# Patient Record
Sex: Female | Born: 1970 | Race: White | Hispanic: No | Marital: Married | Smoking: Former smoker
Health system: Southern US, Community
[De-identification: ages and names within clinical notes are randomized; demographics above are authoritative.]

## PROBLEM LIST (undated history)

## (undated) DIAGNOSIS — I10 Essential (primary) hypertension: Secondary | ICD-10-CM

## (undated) DIAGNOSIS — N390 Urinary tract infection, site not specified: Secondary | ICD-10-CM

## (undated) DIAGNOSIS — Z789 Other specified health status: Secondary | ICD-10-CM

## (undated) DIAGNOSIS — K219 Gastro-esophageal reflux disease without esophagitis: Secondary | ICD-10-CM

## (undated) DIAGNOSIS — J302 Other seasonal allergic rhinitis: Secondary | ICD-10-CM

## (undated) HISTORY — PX: MOUTH SURGERY: SHX715

## (undated) HISTORY — PX: KNEE SURGERY: SHX244

## (undated) HISTORY — PX: NO PAST SURGERIES: SHX2092

---

## 2008-10-25 ENCOUNTER — Ambulatory Visit: Payer: Self-pay | Admitting: Internal Medicine

## 2009-02-22 IMAGING — CR LEFT WRIST - 2 VIEW
1 series · 2 of 2 positions shown · non-contrast
Comparison: none

REASON FOR EXAM: fell pain wrist hand forearm
COMMENTS:

PROCEDURE:     MDR - MDR WRIST LEFT AP AND LATERAL  - October 25, 2008 [DATE]
RESULT:     Two views of the wrist were obtained. No fracture, dislocation
or other acute bony abnormality is identified.

[Series 1: view not recorded · 0.17mm/px · 2 of 2 slices shown]
[im 1/2]
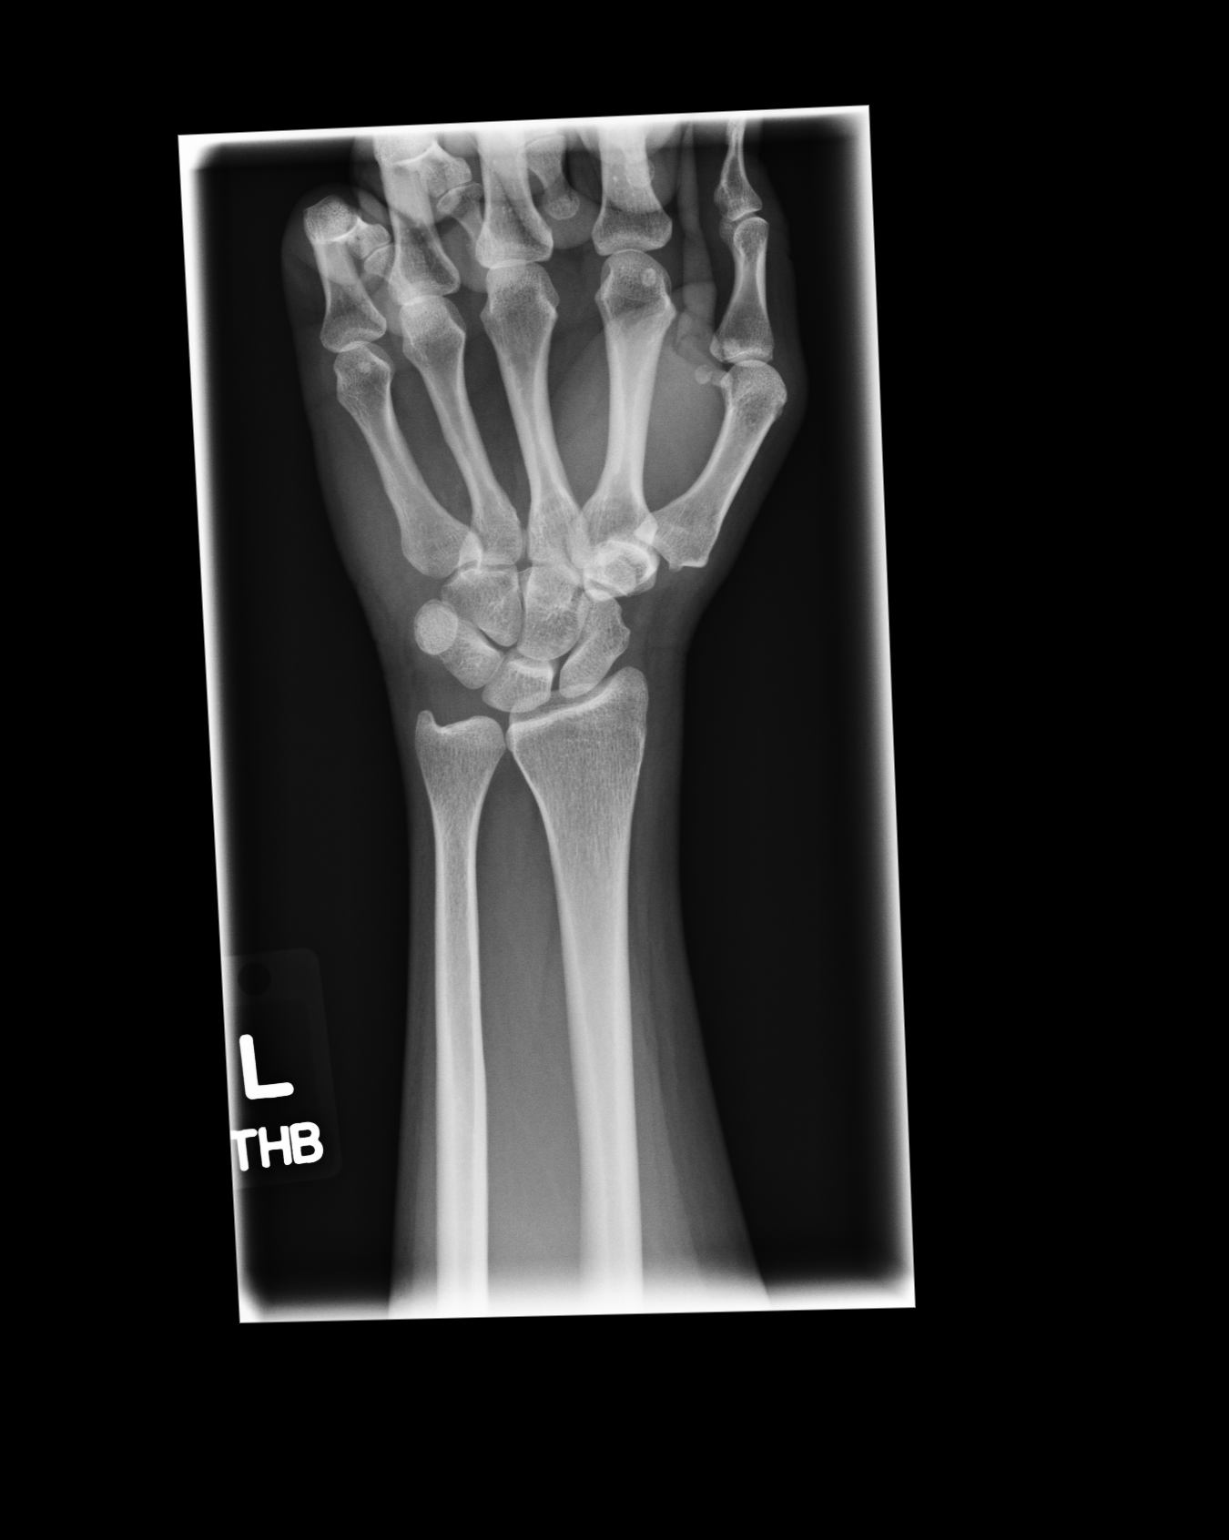
[im 2/2]
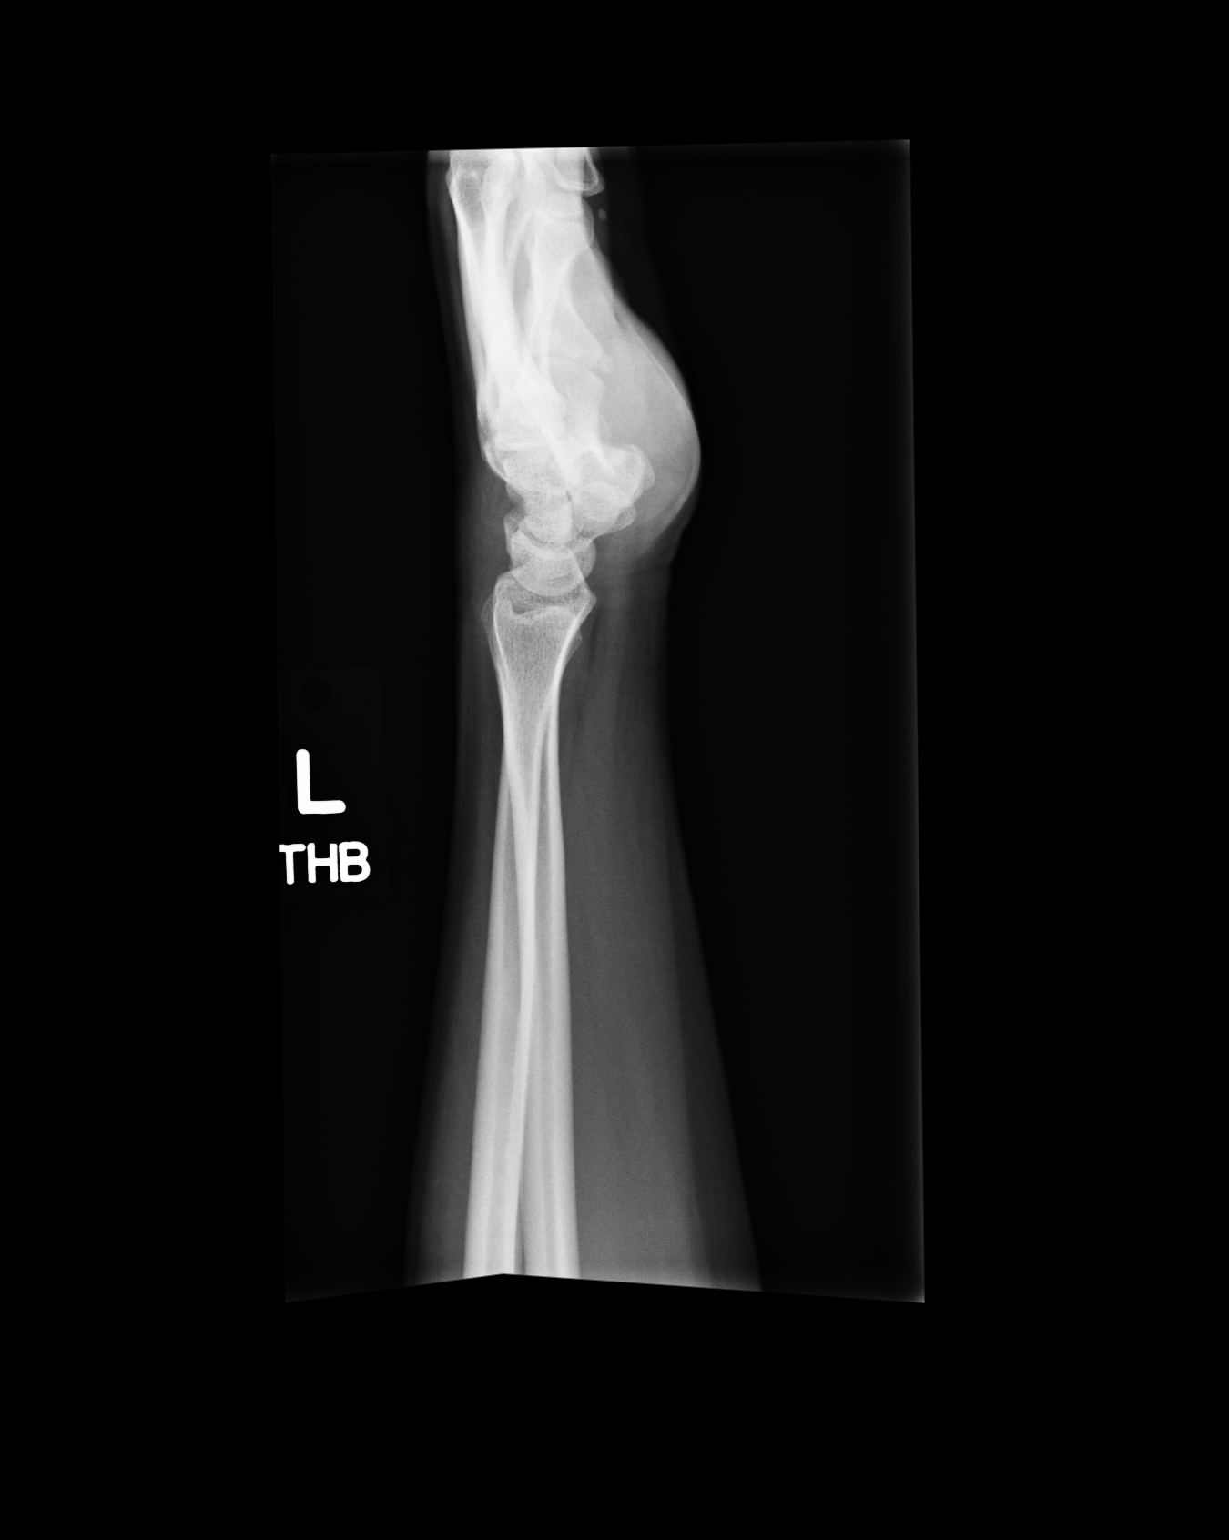

[2 of 2 positions shown; findings below may reference images not displayed]

IMPRESSION: 1.     No significant osseous abnormalities are noted.

## 2011-10-14 ENCOUNTER — Ambulatory Visit: Payer: Self-pay

## 2012-06-09 DIAGNOSIS — R3129 Other microscopic hematuria: Secondary | ICD-10-CM | POA: Insufficient documentation

## 2013-06-19 DIAGNOSIS — A048 Other specified bacterial intestinal infections: Secondary | ICD-10-CM | POA: Insufficient documentation

## 2014-03-13 DIAGNOSIS — Z01419 Encounter for gynecological examination (general) (routine) without abnormal findings: Secondary | ICD-10-CM | POA: Insufficient documentation

## 2015-06-16 ENCOUNTER — Ambulatory Visit
Admission: EM | Admit: 2015-06-16 | Discharge: 2015-06-16 | Disposition: A | Discharge status: Home / Self Care | Payer: Managed Care, Other (non HMO) | Attending: Internal Medicine | Admitting: Internal Medicine

## 2015-06-16 DIAGNOSIS — N390 Urinary tract infection, site not specified: Secondary | ICD-10-CM | POA: Insufficient documentation

## 2015-06-16 DIAGNOSIS — R350 Frequency of micturition: Secondary | ICD-10-CM | POA: Diagnosis present

## 2015-06-16 HISTORY — DX: Other specified health status: Z78.9

## 2015-06-16 LAB — URINALYSIS COMPLETE WITH MICROSCOPIC (ARMC ONLY)
BILIRUBIN URINE: NEGATIVE
Glucose, UA: NEGATIVE mg/dL
Ketones, ur: NEGATIVE mg/dL
Nitrite: POSITIVE — AB
Protein, ur: 100 mg/dL — AB
SQUAMOUS EPITHELIAL / LPF: NONE SEEN — AB
Specific Gravity, Urine: 1.015 (ref 1.005–1.030)
pH: 5 (ref 5.0–8.0)

## 2015-06-16 MED ORDER — NITROFURANTOIN MONOHYD MACRO 100 MG PO CAPS: 100.0000 mg | ORAL_CAPSULE | Freq: Two times a day (BID) | ORAL | Status: DC

## 2015-06-16 NOTE — Discharge Instructions (Signed)
Urinalysis today suggests urinary tract infection; a urine culture is pending. Prescription for macrobid (nitrofurantoin, an antibiotic) was sent to the [facility] in [place]. Recheck for fever >100.5, worsening urinary symptoms, or if not starting to improve in 2-3 days.

## 2015-06-16 NOTE — ED Notes (Signed)
Pt reports she been experiencing lower back pain and urinary frequency since Monday. Pt is also experiencing burning with urination and urgency.

## 2015-06-16 NOTE — ED Provider Notes (Signed)
CSN: [id number]     Arrival date & time 06/16/15  1653 History   First MD Initiated Contact with Patient 06/16/15 1814     Chief Complaint  Patient presents with  . Back Pain  . 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 This became severe last evening, she had a difficult time sleeping. She has had dysuria, urinary frequency. Tactile temps today. Nausea, no vomiting. No change in bowel habits, no diarrhea. No unusual vaginal bleeding or discharge. History of urinary tract infections. She also says that she had a physical with her PCP, [facility], in the last month. Urinalysis demonstrated trace blood, which she says is chronic for her. She is going to have it rechecked with her PCP in a month.  Past Medical History  Diagnosis Date  . Patient denies medical problems    Past Surgical History  Procedure Laterality Date  . No past surgeries     Family History  Problem Relation Age of Onset  . Irritable bowel syndrome Mother   . Nephrolithiasis Mother   . Heart attack Father    Social History  Substance Use Topics  . Smoking status: Former Smoker    Quit date: 11/04/1985  . Smokeless tobacco: Never Used  . Alcohol Use: Yes     Comment: occasionally   Patient has her own cleaning business.  OB History    Gravida Para Term Preterm AB TAB SAB Ectopic Multiple Living   1 0   1          Review of Systems  All other systems reviewed and are negative.   Allergies  Penicillins and Shellfish allergy  Home Medications   Prior to Admission medications   Medication Sig Start Date End Date Taking? Authorizing Provider  acetaminophen (TYLENOL) 325 MG tablet Take 650 mg by mouth every 6 (six) hours as needed.   Yes Historical Provider, MD  levonorgestrel-ethinyl estradiol (JOLESSA) 0.15-0.03 MG tablet Take 1 tablet by mouth daily.   Yes Historical Provider, MD  loratadine  (ALLERGY) 10 MG tablet Take 10 mg by mouth daily.   Yes Historical Provider, MD   BP 130/83 mmHg  Temp(Src) 98.4 F (36.9 C) (Oral)  Resp 16  Ht 5\' 4"  (1.626 m)  Wt 145 lb (65.772 kg)  BMI 24.88 kg/m2  SpO2 99%  LMP 04/17/2015 (Approximate) Physical Exam  Constitutional: She is oriented to person, place, and time. No distress.  Alert, nicely groomed  HENT:  Head: Atraumatic.  Eyes:  Conjugate gaze, no eye redness/drainage  Neck: Neck supple.  Cardiovascular: Normal rate.   Pulmonary/Chest: No respiratory distress.  Abdominal: She exhibits no distension.  Musculoskeletal: Normal range of motion.  No leg swelling  Neurological: She is alert and oriented to person, place, and time.  Skin: Skin is warm and dry.  No cyanosis  Nursing note and vitals reviewed.   ED Course  Procedures  Results for orders placed or performed during the hospital encounter of 06/16/15  Urinalysis complete, with microscopic  Result Value Ref Range   Color, Urine AMBER (A) YELLOW   APPearance CLOUDY (A) CLEAR   Glucose, UA NEGATIVE NEGATIVE mg/dL   Bilirubin Urine NEGATIVE NEGATIVE   Ketones, ur NEGATIVE NEGATIVE mg/dL   Specific Gravity, Urine 1.015 1.005 - 1.030   Hgb urine dipstick 3+ (A) NEGATIVE   pH 5.0 5.0 - 8.0   Protein, ur 100 (  A) NEGATIVE mg/dL   Nitrite POSITIVE (A) NEGATIVE   Leukocytes, UA 1+ (A) NEGATIVE   RBC / HPF TOO NUMEROUS TO COUNT <3 RBC/hpf   WBC, UA TOO NUMEROUS TO COUNT <3 WBC/hpf   Bacteria, UA FEW (A) RARE   Squamous Epithelial / LPF NONE SEEN (A) RARE   Urine culture pending   MDM   1. UTI (lower urinary tract infection)    Discharge Medication List as of 06/16/2015  6:43 PM    START taking these medications   Details  nitrofurantoin, macrocrystal-monohydrate, (MACROBID) 100 MG capsule Take 1 capsule (100 mg total) by mouth 2 (two) times daily., Starting 06/16/2015, Until Discontinued, Normal       Recheck for persistent urinary symptoms, fever >100.5,  or if not starting to improve in a few days.    Eustace Moore, MD 06/16/15 2132

## 2015-06-17 ENCOUNTER — Telehealth: Payer: Self-pay | Admitting: Family Medicine

## 2015-06-17 MED ORDER — FLUCONAZOLE 150 MG PO TABS: 150.0000 mg | ORAL_TABLET | Freq: Every day | ORAL | Status: DC

## 2015-06-17 NOTE — Progress Notes (Signed)
rx sent

## 2015-06-18 LAB — URINE CULTURE

## 2015-06-19 ENCOUNTER — Ambulatory Visit
Admission: EM | Admit: 2015-06-19 | Discharge: 2015-06-19 | Disposition: A | Discharge status: Home / Self Care | Payer: Managed Care, Other (non HMO) | Attending: Emergency Medicine | Admitting: Emergency Medicine

## 2015-06-19 ENCOUNTER — Encounter: Payer: Self-pay | Admitting: Emergency Medicine

## 2015-06-19 DIAGNOSIS — T63461A Toxic effect of venom of wasps, accidental (unintentional), initial encounter: Secondary | ICD-10-CM | POA: Diagnosis not present

## 2015-06-19 MED ORDER — IBUPROFEN 800 MG PO TABS
800.0000 mg | ORAL_TABLET | Freq: Four times a day (QID) | ORAL | Status: DC | PRN
Start: 2015-06-19 — End: 2015-08-08

## 2015-06-19 MED ORDER — FAMOTIDINE 20 MG PO TABS: 20.0000 mg | ORAL_TABLET | Freq: Two times a day (BID) | ORAL | Status: DC

## 2015-06-19 MED ORDER — TRIAMCINOLONE ACETONIDE 0.1 % EX CREA: 1.0000 "application " | TOPICAL_CREAM | Freq: Two times a day (BID) | CUTANEOUS | Status: DC

## 2015-06-19 MED ORDER — SULFAMETHOXAZOLE-TRIMETHOPRIM 800-160 MG PO TABS: 2.0000 | ORAL_TABLET | Freq: Two times a day (BID) | ORAL | Status: DC

## 2015-06-19 NOTE — Discharge Instructions (Signed)
Claritin, Pepcid, ice, ibuprofen, topical skin steroid cream as we discussed. Wait-and-see prescription of Bactrim if not getting better in 48 hours or if getting worse

## 2015-06-19 NOTE — ED Provider Notes (Signed)
HPI  SUBJECTIVE:  Alyssa Valencia is a 44 y.o. female who presents with multiple yellow jacket stings sustained6 yesterday. Patient estimates that she was stung approximately 9 times. She reports burning and itching last night, now increasing erythema, edema, pain at several of the bite sites. She is having pain in her neck, 2 on her back, and one on her lateral right thigh. She states that the one on her leg is the worse, and is reporting joint aches in that leg. She is able to move her knee without any problem. No nausea, vomiting, fevers, lip swelling, diarrhea, syncope, shortness of breath, wheezing, voice changes. She tried 25 mg of Benadryl this morning without improvement. She takes Claritin daily for seasonal allergies. She also tried ice. Symptoms are better with elevation of her leg, worse with bending her knee, and turning her neck. Past medical history negative for diabetes, hypertension, anaphylaxis.  Past Medical History  Diagnosis Date  . Patient denies medical problems     Past Surgical History  Procedure Laterality Date  . No past surgeries      Family History  Problem Relation Age of Onset  . Irritable bowel syndrome Mother   . Nephrolithiasis Mother   . Heart attack Father     Social History  Substance Use Topics  . Smoking status: Former Smoker    Quit date: 11/04/1985  . Smokeless tobacco: Never Used  . Alcohol Use: Yes     Comment: occasionally    No current facility-administered medications for this encounter.  Current outpatient prescriptions:  .  acetaminophen (TYLENOL) 325 MG tablet, Take 650 mg by mouth every 6 (six) hours as needed., Disp: , Rfl:  .  famotidine (PEPCID) 20 MG tablet, Take 1 tablet (20 mg total) by mouth 2 (two) times daily., Disp: 10 tablet, Rfl: 0 .  fluconazole (DIFLUCAN) 150 MG tablet, Take 1 tablet (150 mg total) by mouth daily., Disp: 1 tablet, Rfl: 1 .  ibuprofen (ADVIL,MOTRIN) 800 MG tablet, Take 1 tablet (800 mg total) by mouth  every 6 (six) hours as needed., Disp: 30 tablet, Rfl: 0 .  levonorgestrel-ethinyl estradiol (JOLESSA) 0.15-0.03 MG tablet, Take 1 tablet by mouth daily., Disp: , Rfl:  .  loratadine (ALLERGY) 10 MG tablet, Take 10 mg by mouth daily., Disp: , Rfl:  .  nitrofurantoin, macrocrystal-monohydrate, (MACROBID) 100 MG capsule, Take 1 capsule (100 mg total) by mouth 2 (two) times daily., Disp: 10 capsule, Rfl: 0 .  sulfamethoxazole-trimethoprim (BACTRIM DS,SEPTRA DS) 800-160 MG per tablet, Take 2 tablets by mouth 2 (two) times daily., Disp: 40 tablet, Rfl: 0 .  triamcinolone cream (KENALOG) 0.1 %, Apply 1 application topically 2 (two) times daily. Apply for 2 weeks. May use on face, Disp: 30 g, Rfl: 0  Allergies  Allergen Reactions  . Penicillins     "Drowsy"  . Shellfish Allergy Rash     ROS  As noted in HPI.   Physical Exam  BP 121/82 mmHg  Pulse 77  Temp(Src) 97.2 F (36.2 C) (Tympanic)  Resp 16  Ht  (1.626 m)  Wt 142 lb (64.411 kg)  BMI 24.36 kg/m2  SpO2 100%  LMP 04/17/2015 (Approximate)  Constitutional: Well developed, well nourished, no acute distress Eyes:  EOMI, conjunctiva normal bilaterally HENT: Normocephalic, atraumatic,mucus membranes moist Respiratory: Normal inspiratory effort Cardiovascular: Normal rate GI: nondistended skin: 2 x 1.5 cm warm tender erythema on left neck with central pustule, 5.5 x 5 cm, 6.5 x 5 cm area of warm tender  erythema with central pustules on her back, warm tender area of erythema with central pustule measuring 6 x 4 cm lateral right knee.  Musculoskeletal: no deformities, FROM R knee, knee exam WNL.  Neurologic: Alert & oriented x 3, no focal neuro deficits Psychiatric: Speech and behavior appropriate   ED Course   Medications - No data to display  No orders of the defined types were placed in this encounter.    No results found for this or any previous visit (from the past 24 hour(s)). No results found.  ED Clinical  Impression  Yellow jacket sting, accidental or unintentional, initial encounter  ED Assessment/Plan  No evidence of anaphylaxis  or other systemic involvement. send home with Pepcid for 5 days, ibuprofen, patient to continue her Claritin. Patient to continue ice packs. Also sending home with topical steriod, bactrim DS if areas of erythema started getting worse or not responding to this therapy. Patient follow-up with primary care or return here if not getting better or getting worse. Patient to go to the ED for any signs of anaphylaxis. Discussed MDM, plan and followup with patient . Discussed sn/sx that should prompt return to the UC or ED. Patient agrees with plan.   *This clinic note was created using Dragon dictation software. Therefore, there may be occasional mistakes despite careful proofreading.  ?   Domenick Gong, MD 06/19/15 [phone]

## 2015-06-19 NOTE — ED Notes (Signed)
Patient states that she had several bee stings yesterday. Patient reports swelling at the sites.  Patient denies difficulty breathing or swelling in her tongue.

## 2015-08-08 ENCOUNTER — Ambulatory Visit
Admission: EM | Admit: 2015-08-08 | Discharge: 2015-08-08 | Disposition: A | Discharge status: Home / Self Care | Payer: Managed Care, Other (non HMO) | Attending: Family Medicine | Admitting: Family Medicine

## 2015-08-08 DIAGNOSIS — M545 Low back pain, unspecified: Secondary | ICD-10-CM

## 2015-08-08 DIAGNOSIS — N390 Urinary tract infection, site not specified: Secondary | ICD-10-CM | POA: Diagnosis not present

## 2015-08-08 HISTORY — DX: Urinary tract infection, site not specified: N39.0

## 2015-08-08 LAB — URINALYSIS COMPLETE WITH MICROSCOPIC (ARMC ONLY)
Bilirubin Urine: NEGATIVE
GLUCOSE, UA: NEGATIVE mg/dL
Ketones, ur: NEGATIVE mg/dL
Nitrite: NEGATIVE
Protein, ur: 30 mg/dL — AB
Specific Gravity, Urine: 1.025 (ref 1.005–1.030)
pH: 5 (ref 5.0–8.0)

## 2015-08-08 MED ORDER — CEFIXIME 400 MG PO CAPS: 400.0000 mg | ORAL_CAPSULE | Freq: Every day | ORAL | Status: DC

## 2015-08-08 MED ORDER — FLUCONAZOLE 150 MG PO TABS: 150.0000 mg | ORAL_TABLET | Freq: Every day | ORAL | Status: DC

## 2015-08-08 NOTE — ED Notes (Signed)
Started Friday with slight burning with voiding. Yesterday symptoms worsened. Increased burning, left Nangle back pain, and small amounts

## 2015-08-09 NOTE — ED Provider Notes (Addendum)
CSN: [id number]     Arrival date & time 08/08/15  1517 History   First MD Initiated Contact with Patient 08/08/15 1543     Chief Complaint  Patient presents with  . Cystitis   (Consider location/radiation/quality/duration/timing/severity/associated sxs/prior Treatment) HPI Comments: married caucasian female was seen 11 Aug by Dr Dayton Scrape for UTI symptoms urine culture 4K organisms not identified on macrobid but given bactrim Rx a couple days later for skin infection from insect bites.  Family Hx Mother with kidney cyst sx and lithotripsy for stones unsure of type and frequent UTIs; patient reported she had UTIs when first married but none until this past year unsure why denied new sexual partners.   Has had Maita midline back pain, burning with urination and decreased urinary output per patient.  +Chills denied fever, rash, vomiting, headache.  I think I caught this one a lot earlier.  Have not seen any blood this time.  Has azo OTC at home.  Bladder spasms after sex.  The history is provided by the patient.    Past Medical History  Diagnosis Date  . Patient denies medical problems   . UTI (lower urinary tract infection)    Past Surgical History  Procedure Laterality Date  . No past surgeries     Family History  Problem Relation Age of Onset  . Irritable bowel syndrome Mother   . Nephrolithiasis Mother   . Heart attack Father    Social History  Substance Use Topics  . Smoking status: Former Smoker    Quit date: 11/04/1985  . Smokeless tobacco: Never Used  . Alcohol Use: Yes     Comment: occasionally   OB History    Gravida Para Term Preterm AB TAB SAB Ectopic Multiple Living   1 0   1          Review of Systems  Constitutional: Positive for chills. Negative for fever, diaphoresis, activity change, appetite change, fatigue and unexpected weight change.  HENT: Negative for congestion, dental problem, drooling, ear discharge, ear pain, facial swelling, hearing loss, mouth sores,  nosebleeds, postnasal drip, rhinorrhea, sinus pressure, sneezing, sore throat, tinnitus, trouble swallowing and voice change.   Eyes: Negative for photophobia, pain, discharge, redness, itching and visual disturbance.  Respiratory: Negative for cough, choking, shortness of breath, wheezing and stridor.   Cardiovascular: Negative for chest pain and leg swelling.  Gastrointestinal: Negative for nausea, vomiting, abdominal pain, diarrhea, constipation, blood in stool and abdominal distention.  Endocrine: Negative for cold intolerance and heat intolerance.  Genitourinary: Positive for dysuria, urgency, frequency and decreased urine volume. Negative for hematuria, flank pain, vaginal bleeding, vaginal discharge, enuresis, difficulty urinating, genital sores, vaginal pain, menstrual problem and pelvic pain.  Musculoskeletal: Positive for back pain. Negative for myalgias, joint swelling, arthralgias, gait problem, neck pain and neck stiffness.  Skin: Negative for color change, pallor, rash and wound.  Allergic/Immunologic: Positive for environmental allergies. Negative for food allergies.  Neurological: Negative for dizziness, tremors, seizures, syncope, facial asymmetry, speech difficulty, weakness, light-headedness, numbness and headaches.  Hematological: Negative for adenopathy. Does not bruise/bleed easily.  Psychiatric/Behavioral: Negative for behavioral problems, confusion, sleep disturbance and agitation.    Allergies  Penicillins and Shellfish allergy  Home Medications   Prior to Admission medications   Medication Sig Start Date End Date Taking? Authorizing Provider  acetaminophen (TYLENOL) 325 MG tablet Take 650 mg by mouth every 6 (six) hours as needed.   Yes Historical Provider, MD  famotidine (PEPCID) 20 MG tablet Take 1  tablet (20 mg total) by mouth 2 (two) times daily. 06/19/15  Yes Domenick Gong, MD  levonorgestrel-ethinyl estradiol (JOLESSA) 0.15-0.03 MG tablet Take 1 tablet by  mouth daily.   Yes Historical Provider, MD  Cefixime (SUPRAX) 400 MG CAPS capsule Take 1 capsule (400 mg total) by mouth daily. 08/08/15   Barbaraann Barthel, NP  fluconazole (DIFLUCAN) 150 MG tablet Take 1 tablet (150 mg total) by mouth daily. 08/08/15   Barbaraann Barthel, NP  loratadine (ALLERGY) 10 MG tablet Take 10 mg by mouth daily.    Historical Provider, MD   Meds Ordered and Administered this Visit  Medications - No data to display  BP 130/86 mmHg  Pulse 72  Temp(Src) 98 F (36.7 C) (Tympanic)  Resp 16  Ht 5\' 4"  (1.626 m)  Wt 142 lb (64.411 kg)  BMI 24.36 kg/m2  SpO2 100%  LMP 06/22/2015 (Approximate) No data found.   Physical Exam  Constitutional: She is oriented to person, place, and time. Vital signs are normal. She appears well-developed and well-nourished. No distress.  HENT:  Head: Normocephalic and atraumatic.  Right Ear: External ear normal.  Left Ear: External ear normal.  Nose: Nose normal.  Mouth/Throat: Oropharynx is clear and moist. No oropharyngeal exudate.  Eyes: Conjunctivae, EOM and lids are normal. Pupils are equal, round, and reactive to light. Right eye exhibits no discharge. Left eye exhibits no discharge. No scleral icterus.  Neck: Trachea normal and normal range of motion. Neck supple. No tracheal tenderness, no spinous process tenderness and no muscular tenderness present. No rigidity. No tracheal deviation, no edema, no erythema and normal range of motion present. No thyroid mass and no thyromegaly present.  Cardiovascular: Normal rate, regular rhythm, S1 normal, S2 normal, normal heart sounds and intact distal pulses.  Exam reveals no gallop and no friction rub.   No murmur heard. Pulmonary/Chest: Effort normal and breath sounds normal. No accessory muscle usage or stridor. No respiratory distress. She has no decreased breath sounds. She has no wheezes. She has no rhonchi. She has no rales. She exhibits no tenderness.  Abdominal: Soft. Bowel sounds are  normal. She exhibits no shifting dullness, no distension, no pulsatile liver, no fluid wave, no abdominal bruit, no ascites, no pulsatile midline mass and no mass. There is no hepatosplenomegaly. There is no tenderness. There is no rigidity, no rebound, no guarding, no CVA tenderness, no tenderness at McBurney's point and negative Murphy's sign. Hernia confirmed negative in the ventral area.  Dull to percussion x 3 quads; tympanny RUQ  Musculoskeletal: Normal range of motion. She exhibits no edema.       Cervical back: Normal.       Thoracic back: Normal.       Lumbar back: She exhibits tenderness, pain and spasm. She exhibits normal range of motion, no bony tenderness, no swelling, no edema, no deformity, no laceration and normal pulse.       Back:  Bilateral L1-L3 paraspinal spasms and slightly TTP bilateral SI joints; full AROM  Lymphadenopathy:    She has no cervical adenopathy.  Neurological: She is alert and oriented to person, place, and time. She displays no atrophy and no tremor. No cranial nerve deficit or sensory deficit. She exhibits normal muscle tone. She displays no seizure activity. Coordination and gait normal. GCS eye subscore is 4. GCS verbal subscore is 5. GCS motor subscore is 6.  Reflex Scores:      Patellar reflexes are 2+ on the right side and 2+  on the left side.      Achilles reflexes are 2+ on the right side and 2+ on the left side. Skin: Skin is warm, dry and intact. No rash noted. She is not diaphoretic. No erythema. No pallor.  Psychiatric: She has a normal mood and affect. Her speech is normal and behavior is normal. Judgment and thought content normal. Cognition and memory are normal.  Nursing note and vitals reviewed.   ED Course  Procedures (including critical care time)  Labs Review Labs Reviewed  URINALYSIS COMPLETEWITH MICROSCOPIC ([facility] ONLY) - Abnormal; Notable for the following:    APPearance HAZY (*)    Hgb urine dipstick 2+ (*)    Protein, ur 30  (*)    Leukocytes, UA 1+ (*)    Bacteria, UA MANY (*)    Squamous Epithelial / LPF 6-30 (*)    All other components within normal limits  URINE CULTURE  Reviewed previous urinalysis and urine culture results in Epic Care everywhere.  E.coli no resistence.  Imaging Review No results found.   MDM   1. UTI (lower urinary tract infection)   2. Midline Kinser back pain without sciatica    Last  UTI 11 Aug completed macrobid and bactrim and still felt like symptoms didn't completely resolve.  Last urine culture 2014 e.coli susceptible to everything.  Allergy to penicillin; cephalosporins ok in the past.  Wants different antibiotic today.  Discussed urinalysis results and given copy of previous urinalysis and culture results to take to [facility] for follow up visit routine physical scheduled for one week.  Discussed may be more frequent UTIs due to perimenopause or has become colonized and may require longer treatment.  Will mail urine culture results when available per patient request discussed typically 48 hours with patient.  If resistance to prescribed antibiotic will call with new antibiotic name/instructions also.  Consider urology referral/prophylaxis antibiotics with sex to discuss with PCM at appt next week.  Suprax 400mg  po daily dosing preferred by patient x 7 days.  Follow up for re-evaluation if unable to void every 8 hours, gross hematuria, worsening back pain, vomiting, abdominal pain, tea colored urine.  Push fluids.  Has OTC pyridium at home for prn po use.  Currently showers after sex.  Discussed to use back up birth control due to antibiotic use may decrease efficicacy.  Frequently gets vaginal yeast infections after antibiotic use diflucan 150mg  po x 1 Rx given to patient for prn use.  Discussed po intake of active cultures, avoid pantiliner daily use.   Call or return to clinic as needed if these symptoms worsen or fail to improve as anticipated.  [facility] handout on cystitis, kidney stone  prevention diet given to patient.  Discussed with patient due to small amount of urine, urine culture may be negative this time also.  Discussed signs and symptoms of kidney stones with patient and patient to ask mother what type of kidney stones she has also.  Discussed with patient if sand sized symptoms could be similar to UTI; larger obstructing stones typically more flank/back pain.  Patient refused imaging at this time.  Patient verbalized understanding of information/instructions, agreed with plan of care and had no further questions at this time. P2: hydration  For acute pain, rest, and intermittent application of heat (do not sleep on heating pad).  I discussed longer-term treatment plan of PRN PO NSAIDS tylenol 1000mg  po QID prn and I discussed a home back care exercise program with a strengthening and flexibility  exercise.  Proper avoidance of heavy lifting discussed.  Consider physical therapy or chiropractic care and radiology if not improving.  Call or return to clinic as needed if these symptoms worsen or fail to improve as anticipated especially leg weakness, loss of bowel/bladder control or saddle paresthesias.   Patient verbalized understanding of instructions/information and agreed with plan of care.  P2:  Injury Prevention, fitness Barbaraann Barthel, NP 08/09/15 1317  Barbaraann Barthel, NP 08/09/15 1446  12 Aug 2015 at 1930  Telephone message left for patient to contact clinic for lab results and letter sent to address on file.  E Coli no resistance noted on culture.  Barbaraann Barthel, NP 08/12/15 1932

## 2015-08-09 NOTE — Discharge Instructions (Signed)
Dietary Guidelines to Help Prevent Kidney Stones Your risk of kidney stones can be decreased by adjusting the foods you eat. The most important thing you can do is drink enough fluid. You should drink enough fluid to keep your urine clear or pale yellow. The following guidelines provide specific information for the type of kidney stone you have had. GUIDELINES ACCORDING TO TYPE OF KIDNEY STONE Calcium Oxalate Kidney Stones  Reduce the amount of salt you eat. Foods that have a lot of salt cause your body to release excess calcium into your urine. The excess calcium can combine with a substance called oxalate to form kidney stones.  Reduce the amount of animal protein you eat if the amount you eat is excessive. Animal protein causes your body to release excess calcium into your urine. Ask your dietitian how much protein from animal sources you should be eating.  Avoid foods that are high in oxalates. If you take vitamins, they should have less than 500 mg of vitamin C. Your body turns vitamin C into oxalates. You do not need to avoid fruits and vegetables high in vitamin C. Calcium Phosphate Kidney Stones  Reduce the amount of salt you eat to help prevent the release of excess calcium into your urine.  Reduce the amount of animal protein you eat if the amount you eat is excessive. Animal protein causes your body to release excess calcium into your urine. Ask your dietitian how much protein from animal sources you should be eating.  Get enough calcium from food or take a calcium supplement (ask your dietitian for recommendations). Food sources of calcium that do not increase your risk of kidney stones include:  Broccoli.  Dairy products, such as cheese and yogurt.  Pudding. Uric Acid Kidney Stones  Do not have more than 6 oz of animal protein per day. FOOD SOURCES Animal Protein Sources  Meat (all types).  Poultry.  Eggs.  Fish, seafood. Foods High in [employer] seasonings.  Soy  sauce.  Teriyaki sauce.  Cured and processed meats.  Salted crackers and snack foods.  Fast food.  Canned soups and most canned foods. Foods High in Oxalates  Grains:  Amaranth.  Barley.  Grits.  Wheat germ.  Bran.  Buckwheat flour.  All bran cereals.  Pretzels.  Whole wheat bread.  Vegetables:  Beans (wax).  Beets and beet greens.  Collard greens.  Eggplant.  [place]scarole.  Leeks.  Okra.  Parsley.  Rutabagas.  Spinach.  Swiss chard.  Tomato paste.  Fried potatoes.  Sweet potatoes.  Fruits:  Red currants.  Figs.  Kiwi.  Rhubarb.  Meat and Other Protein Sources:  Beans (dried).  Soy burgers and other soybean products.  Miso.  Nuts (peanuts, almonds, pecans, cashews, hazelnuts).  Nut butters.  Sesame seeds and tahini (paste made of sesame seeds).  Poppy seeds.  Beverages:  Chocolate drink mixes.  Soy milk.  Instant iced tea.  Juices made from high-oxalate fruits or vegetables.  Other:  Carob.  Chocolate.  Fruitcake.  Marmalades. Document Released: 02/16/2011 Document Revised: 10/27/2013 Document Reviewed: 09/18/2013 [facility] Patient Information 2015 [place], [place]. This information is not intended to replace advice given to you by your health care provider. Make sure you discuss any questions you have with your health care provider. Urinary Tract Infection Urinary tract infections (UTIs) can develop anywhere along your urinary tract. Your urinary tract is your body's drainage system for removing wastes and extra water. Your urinary tract includes two kidneys, two ureters, a  bladder, and a urethra. Your kidneys are a pair of bean-shaped organs. Each kidney is about the size of your fist. They are located below your ribs, one on each side of your spine. CAUSES Infections are caused by microbes, which are microscopic organisms, including fungi, viruses, and bacteria. These organisms are so small that they can  only be seen through a microscope. Bacteria are the microbes that most commonly cause UTIs. SYMPTOMS  Symptoms of UTIs may vary by age and gender of the patient and by the location of the infection. Symptoms in young women typically include a frequent and intense urge to urinate and a painful, burning feeling in the bladder or urethra during urination. Older women and men are more likely to be tired, shaky, and weak and have muscle aches and abdominal pain. A fever may mean the infection is in your kidneys. Other symptoms of a kidney infection include pain in your back or sides below the ribs, nausea, and vomiting. DIAGNOSIS To diagnose a UTI, your caregiver will ask you about your symptoms. Your caregiver also will ask to provide a urine sample. The urine sample will be tested for bacteria and white blood cells. White blood cells are made by your body to help fight infection. TREATMENT  Typically, UTIs can be treated with medication. Because most UTIs are caused by a bacterial infection, they usually can be treated with the use of antibiotics. The choice of antibiotic and length of treatment depend on your symptoms and the type of bacteria causing your infection. HOME CARE INSTRUCTIONS  If you were prescribed antibiotics, take them exactly as your caregiver instructs you. Finish the medication even if you feel better after you have only taken some of the medication.  Drink enough water and fluids to keep your urine clear or pale yellow.  Avoid caffeine, tea, and carbonated beverages. They tend to irritate your bladder.  Empty your bladder often. Avoid holding urine for long periods of time.  Empty your bladder before and after sexual intercourse.  After a bowel movement, women should cleanse from front to back. Use each tissue only once. SEEK MEDICAL CARE IF:   You have back pain.  You develop a fever.  Your symptoms do not begin to resolve within 3 days. SEEK IMMEDIATE MEDICAL CARE IF:     You have severe back pain or lower abdominal pain.  You develop chills.  You have nausea or vomiting.  You have continued burning or discomfort with urination. MAKE SURE YOU:   Understand these instructions.  Will watch your condition.  Will get help right away if you are not doing well or get worse. Document Released: 08/01/2005 Document Revised: 04/22/2012 Document Reviewed: 11/30/2011 [facility] Patient Information 2015 [place], [place]. This information is not intended to replace advice given to you by your health care provider. Make sure you discuss any questions you have with your health care provider. Back Exercises Back exercises help treat and prevent back injuries. The goal of back exercises is to increase the strength of your abdominal and back muscles and the flexibility of your back. These exercises should be started when you no longer have back pain. Back exercises include:  Pelvic Tilt. Lie on your back with your knees bent. Tilt your pelvis until the lower part of your back is against the floor. Hold this position 5 to 10 sec and repeat 5 to 10 times.  Knee to Chest. Pull first 1 knee up against your chest and hold for 20 to  30 seconds, repeat this with the other knee, and then both knees. This may be done with the other leg straight or bent, whichever feels better.  Sit-Ups or Curl-Ups. Bend your knees 90 degrees. Start with tilting your pelvis, and do a partial, slow sit-up, lifting your trunk only 30 to 45 degrees off the floor. Take at least 2 to 3 seconds for each sit-up. Do not do sit-ups with your knees out straight. If partial sit-ups are difficult, simply do the above but with only tightening your abdominal muscles and holding it as directed.  Hip-Lift. Lie on your back with your knees flexed 90 degrees. Push down with your feet and shoulders as you raise your hips a couple inches off the floor; hold for 10 seconds, repeat 5 to 10 times.  Back arches. Lie on your  stomach, propping yourself up on bent elbows. Slowly press on your hands, causing an arch in your [place] back. Repeat 3 to 5 times. Any initial stiffness and discomfort should lessen with repetition over time.  Shoulder-Lifts. Lie face down with arms beside your body. Keep hips and torso pressed to floor as you slowly lift your head and shoulders off the floor. Do not overdo your exercises, especially in the beginning. Exercises may cause you some mild back discomfort which lasts for a few minutes; however, if the pain is more severe, or lasts for more than 15 minutes, do not continue exercises until you see your caregiver. Improvement with exercise therapy for back problems is slow.  See your caregivers for assistance with developing a proper back exercise program. Document Released: 11/29/2004 Document Revised: 01/14/2012 Document Reviewed: 08/23/2011 [facility] Patient Information 2015 [place], [place]. This information is not intended to replace advice given to you by your health care provider. Make sure you discuss any questions you have with your health care provider.

## 2015-08-11 LAB — URINE CULTURE

## 2015-09-19 ENCOUNTER — Ambulatory Visit
Admission: EM | Admit: 2015-09-19 | Discharge: 2015-09-19 | Disposition: A | Discharge status: Home / Self Care | Payer: Managed Care, Other (non HMO) | Attending: Family Medicine | Admitting: Family Medicine

## 2015-09-19 ENCOUNTER — Encounter: Payer: Self-pay | Admitting: Emergency Medicine

## 2015-09-19 DIAGNOSIS — J0101 Acute recurrent maxillary sinusitis: Secondary | ICD-10-CM | POA: Diagnosis not present

## 2015-09-19 DIAGNOSIS — R42 Dizziness and giddiness: Secondary | ICD-10-CM

## 2015-09-19 DIAGNOSIS — H6593 Unspecified nonsuppurative otitis media, bilateral: Secondary | ICD-10-CM | POA: Diagnosis not present

## 2015-09-19 MED ORDER — SALINE SPRAY 0.65 % NA SOLN: 2.0000 | NASAL | Status: DC

## 2015-09-19 MED ORDER — ACETAMINOPHEN 500 MG PO TABS: 500.0000 mg | ORAL_TABLET | Freq: Four times a day (QID) | ORAL | Status: DC | PRN

## 2015-09-19 MED ORDER — DOXYCYCLINE HYCLATE 100 MG PO CAPS: 100.0000 mg | ORAL_CAPSULE | Freq: Two times a day (BID) | ORAL | Status: DC

## 2015-09-19 MED ORDER — MECLIZINE HCL 25 MG PO TABS: 25.0000 mg | ORAL_TABLET | Freq: Three times a day (TID) | ORAL | Status: DC | PRN

## 2015-09-19 MED ORDER — FLUCONAZOLE 150 MG PO TABS: 150.0000 mg | ORAL_TABLET | Freq: Every day | ORAL | Status: DC

## 2015-09-19 MED ORDER — FLUTICASONE PROPIONATE 50 MCG/ACT NA SUSP: 1.0000 | Freq: Two times a day (BID) | NASAL | Status: DC

## 2015-09-19 NOTE — ED Notes (Signed)
Patient c/o pain in both ears, nasal congestion, sinus pain and pressure, and HAs for a week.

## 2015-09-19 NOTE — Discharge Instructions (Signed)
Benign Positional Vertigo °Vertigo is the feeling that you or your surroundings are moving when they are not. Benign positional vertigo is the most common form of vertigo. The cause of this condition is not serious (is benign). This condition is triggered by certain movements and positions (is positional). This condition can be dangerous if it occurs while you are doing something that could endanger you or others, such as driving.  °CAUSES °In many cases, the cause of this condition is not known. It may be caused by a disturbance in an area of the inner ear that helps your brain to sense movement and balance. This disturbance can be caused by a viral infection (labyrinthitis), head injury, or repetitive motion. °RISK FACTORS °This condition is more likely to develop in: °· Women. °· People who are 50 years of age or older. °SYMPTOMS °Symptoms of this condition usually happen when you move your head or your eyes in different directions. Symptoms may start suddenly, and they usually last for less than a minute. Symptoms may include: °· Loss of balance and falling. °· Feeling like you are spinning or moving. °· Feeling like your surroundings are spinning or moving. °· Nausea and vomiting. °· Blurred vision. °· Dizziness. °· Involuntary eye movement (nystagmus). °Symptoms can be mild and cause only slight annoyance, or they can be severe and interfere with daily life. Episodes of benign positional vertigo may return (recur) over time, and they may be triggered by certain movements. Symptoms may improve over time. °DIAGNOSIS °This condition is usually diagnosed by medical history and a physical exam of the head, neck, and ears. You may be referred to a health care provider who specializes in ear, nose, and throat (ENT) problems (otolaryngologist) or a provider who specializes in disorders of the nervous system (neurologist). You may have additional testing, including: °· MRI. °· A CT scan. °· Eye movement tests. Your  health care provider may ask you to change positions quickly while he or she watches you for symptoms of benign positional vertigo, such as nystagmus. Eye movement may be tested with an electronystagmogram (ENG), caloric stimulation, the Dix-Hallpike test, or the roll test. °· An electroencephalogram (EEG). This records electrical activity in your brain. °· Hearing tests. °TREATMENT °Usually, your health care provider will treat this by moving your head in specific positions to adjust your inner ear back to normal. Surgery may be needed in severe cases, but this is rare. In some cases, benign positional vertigo may resolve on its own in 2-4 weeks. °HOME CARE INSTRUCTIONS °Safety °· Move slowly. Avoid sudden body or head movements. °· Avoid driving. °· Avoid operating heavy machinery. °· Avoid doing any tasks that would be dangerous to you or others if a vertigo episode would occur. °· If you have trouble walking or keeping your balance, try using a cane for stability. If you feel dizzy or unstable, sit down right away. °· Return to your normal activities as told by your health care provider. Ask your health care provider what activities are safe for you. °General Instructions °· Take over-the-counter and prescription medicines only as told by your health care provider. °· Avoid certain positions or movements as told by your health care provider. °· Drink enough fluid to keep your urine clear or pale yellow. °· Keep all follow-up visits as told by your health care provider. This is important. °SEEK MEDICAL CARE IF: °· You have a fever. °· Your condition gets worse or you develop new symptoms. °· Your family or friends   notice any behavioral changes.  Your nausea or vomiting gets worse.  You have numbness or a "pins and needles" sensation. SEEK IMMEDIATE MEDICAL CARE IF:  You have difficulty speaking or moving.  You are always dizzy.  You faint.  You develop severe headaches.  You have weakness in your  legs or arms.  You have changes in your hearing or vision.  You develop a stiff neck.  You develop sensitivity to light.   This information is not intended to replace advice given to you by your health care provider. Make sure you discuss any questions you have with your health care provider.   Document Released: 07/30/2006 Document Revised: 07/13/2015 Document Reviewed: 02/14/2015 Elsevier Interactive Patient Education 2016 [employer] Otitis Media With Effusion Otitis media with effusion is the presence of fluid in the middle ear. This is a common problem in children, which often follows ear infections. It may be present for weeks or longer after the infection. Unlike an acute ear infection, otitis media with effusion refers only to fluid behind the ear drum and not infection. Children with repeated ear and sinus infections and allergy problems are the most likely to get otitis media with effusion. CAUSES  The most frequent cause of the fluid buildup is dysfunction of the eustachian tubes. These are the tubes that drain fluid in the ears to the back of the nose (nasopharynx). SYMPTOMS   The main symptom of this condition is hearing loss. As a result, you or your child may:  Listen to the TV at a loud volume.  Not respond to questions.  Ask "what" often when spoken to.  Mistake or confuse one sound or word for another.  There may be a sensation of fullness or pressure but usually not pain. DIAGNOSIS   Your health care provider will diagnose this condition by examining you or your child's ears.  Your health care provider may test the pressure in you or your child's ear with a tympanometer.  A hearing test may be conducted if the problem persists. TREATMENT   Treatment depends on the duration and the effects of the effusion.  Antibiotics, decongestants, nose drops, and cortisone-type drugs (tablets or nasal spray) may not be helpful.  Children with persistent ear effusions  may have delayed language or behavioral problems. Children at risk for developmental delays in hearing, learning, and speech may require referral to a specialist earlier than children not at risk.  You or your child's health care provider may suggest a referral to an ear, nose, and throat surgeon for treatment. The following may help restore normal hearing:  Drainage of fluid.  Placement of ear tubes (tympanostomy tubes).  Removal of adenoids (adenoidectomy). HOME CARE INSTRUCTIONS   Avoid secondhand smoke.  Infants who are breastfed are less likely to have this condition.  Avoid feeding infants while they are lying flat.  Avoid known environmental allergens.  Avoid people who are sick. SEEK MEDICAL CARE IF:   Hearing is not better in 3 months.  Hearing is worse.  Ear pain.  Drainage from the ear.  Dizziness. MAKE SURE YOU:   Understand these instructions.  Will watch your condition.  Will get help right away if you are not doing well or get worse.   This information is not intended to replace advice given to you by your health care provider. Make sure you discuss any questions you have with your health care provider.   Document Released: 11/29/2004 Document Revised: 11/12/2014 Document Reviewed: 05/19/2013 Elsevier  Interactive Patient Education 2016 [employer] Sinusitis, Adult Sinusitis is redness, soreness, and inflammation of the paranasal sinuses. Paranasal sinuses are air pockets within the bones of your face. They are located beneath your eyes, in the middle of your forehead, and above your eyes. In healthy paranasal sinuses, mucus is able to drain out, and air is able to circulate through them by way of your nose. However, when your paranasal sinuses are inflamed, mucus and air can become trapped. This can allow bacteria and other germs to grow and cause infection. Sinusitis can develop quickly and last only a short time (acute) or continue over a long period  (chronic). Sinusitis that lasts for more than 12 weeks is considered chronic. CAUSES Causes of sinusitis include:  Allergies.  Structural abnormalities, such as displacement of the cartilage that separates your nostrils (deviated septum), which can decrease the air flow through your nose and sinuses and affect sinus drainage.  Functional abnormalities, such as when the small hairs (cilia) that line your sinuses and help remove mucus do not work properly or are not present. SIGNS AND SYMPTOMS Symptoms of acute and chronic sinusitis are the same. The primary symptoms are pain and pressure around the affected sinuses. Other symptoms include:  Upper toothache.  Earache.  Headache.  Bad breath.  Decreased sense of smell and taste.  A cough, which worsens when you are lying flat.  Fatigue.  Fever.  Thick drainage from your nose, which often is green and may contain pus (purulent).  Swelling and warmth over the affected sinuses. DIAGNOSIS Your health care provider will perform a physical exam. During your exam, your health care provider may perform any of the following to help determine if you have acute sinusitis or chronic sinusitis:  Look in your nose for signs of abnormal growths in your nostrils (nasal polyps).  Tap over the affected sinus to check for signs of infection.  View the inside of your sinuses using an imaging device that has a light attached (endoscope). If your health care provider suspects that you have chronic sinusitis, one or more of the following tests may be recommended:  Allergy tests.  Nasal culture. A sample of mucus is taken from your nose, sent to a lab, and screened for bacteria.  Nasal cytology. A sample of mucus is taken from your nose and examined by your health care provider to determine if your sinusitis is related to an allergy. TREATMENT Most cases of acute sinusitis are related to a viral infection and will resolve on their own within 10  days. Sometimes, medicines are prescribed to help relieve symptoms of both acute and chronic sinusitis. These may include pain medicines, decongestants, nasal steroid sprays, or saline sprays. However, for sinusitis related to a bacterial infection, your health care provider will prescribe antibiotic medicines. These are medicines that will help kill the bacteria causing the infection. Rarely, sinusitis is caused by a fungal infection. In these cases, your health care provider will prescribe antifungal medicine. For some cases of chronic sinusitis, surgery is needed. Generally, these are cases in which sinusitis recurs more than 3 times per year, despite other treatments. HOME CARE INSTRUCTIONS  Drink plenty of water. Water helps thin the mucus so your sinuses can drain more easily.  Use a humidifier.  Inhale steam 3-4 times a day (for example, sit in the bathroom with the shower running).  Apply a warm, moist washcloth to your face 3-4 times a day, or as directed by your health care provider.  Use saline nasal sprays to help moisten and clean your sinuses.  Take medicines only as directed by your health care provider.  If you were prescribed either an antibiotic or antifungal medicine, finish it all even if you start to feel better. SEEK IMMEDIATE MEDICAL CARE IF:  You have increasing pain or severe headaches.  You have nausea, vomiting, or drowsiness.  You have swelling around your face.  You have vision problems.  You have a stiff neck.  You have difficulty breathing.   This information is not intended to replace advice given to you by your health care provider. Make sure you discuss any questions you have with your health care provider.   Document Released: 10/22/2005 Document Revised: 11/12/2014 Document Reviewed: 11/06/2011 Elsevier Interactive Patient Education [employer]

## 2015-09-20 NOTE — ED Provider Notes (Signed)
CSN: [id number]     Arrival date & time 09/19/15  1345 History   First MD Initiated Contact with Patient 09/19/15 1626     Chief Complaint  Patient presents with  . Otalgia  . Facial Pain  . 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 Occiput headache Saturday 12 Nov and frontal sinuses . Migraine plus sinus headache took her sumatriptan that she hadn't needed in over a year helped but Saturday but headache returned Sunday and it didn't help.  Allergies Penicillin  PMHx migraines, UTIs  PSHx denied in previous 20 years  M-heart problems, strokes, UTIs  Has tried vicks sinus and tylenol also without relief.  Walking to bathroom at home this am needed to put hands on wall as off-balance  Ok now unless she does quick head movements.  Patient is a 44 y.o. female presenting with ear pain. The history is provided by the patient.  Otalgia Location:  Bilateral Behind ear:  No abnormality Quality:  Pressure Severity:  Moderate Onset quality:  Sudden Duration:  1 week Timing:  Intermittent Progression:  Unchanged Chronicity:  New Context: not direct blow, not elevation change, not foreign body in ear, not loud noise and no water in ear   Relieved by:  Nothing Worsened by:  Coughing and swallowing Ineffective treatments:  Position, palpation and OTC medications Associated symptoms: congestion and headaches   Associated symptoms: no abdominal pain, no cough, no diarrhea, no ear discharge, no fever, no hearing loss, no neck pain, no rash, no rhinorrhea, no sore throat, no tinnitus and no vomiting   Congestion:    Location:  Nasal   Interferes with sleep: no   Headaches:    Severity:  Moderate   Onset quality:  Sudden   Duration:  3 days   Timing:  Intermittent   Progression:  Waxing and waning  Chronicity:  Recurrent Risk factors: no recent travel, no chronic ear infection and no prior ear surgery     Past Medical History  Diagnosis Date  . Patient denies medical problems   . UTI (lower urinary tract infection)    Past Surgical History  Procedure Laterality Date  . No past surgeries     Family History  Problem Relation Age of Onset  . Irritable bowel syndrome Mother   . Nephrolithiasis Mother   . Heart attack Father    Social History  Substance Use Topics  . Smoking status: Former Smoker    Quit date: 11/04/1985  . Smokeless tobacco: Never Used  . Alcohol Use: Yes     Comment: occasionally   OB History    Gravida Para Term Preterm AB TAB SAB Ectopic Multiple Living   1 0   1          Review of Systems  Constitutional: Negative for fever, chills, diaphoresis, activity change, appetite change, fatigue and unexpected weight change.  HENT: Positive for congestion, ear pain, postnasal drip and sinus pressure. Negative for dental problem, drooling, ear discharge, facial swelling, hearing loss, mouth sores, nosebleeds, rhinorrhea, sneezing, sore throat, tinnitus, trouble swallowing and voice change.   Eyes: Negative for photophobia, pain, discharge, redness, itching and visual disturbance.  Respiratory: Negative for cough, choking, chest tightness, shortness of breath, wheezing and stridor.   Cardiovascular: Negative for chest pain, palpitations and leg swelling.  Gastrointestinal: Negative for nausea, vomiting, abdominal pain, diarrhea, constipation, blood in stool and abdominal  distention.  Endocrine: Negative for cold intolerance and heat intolerance.  Genitourinary: Negative for dysuria, hematuria and difficulty urinating.  Musculoskeletal: Negative for myalgias, back pain, joint swelling, arthralgias, gait problem, neck pain and neck stiffness.  Skin: Negative for color change, pallor, rash and wound.  Allergic/Immunologic: Positive for environmental allergies.  Negative for food allergies.  Neurological: Positive for dizziness, light-headedness and headaches. Negative for tremors, seizures, syncope, facial asymmetry, speech difficulty and numbness.  Hematological: Negative for adenopathy. Does not bruise/bleed easily.  Psychiatric/Behavioral: Negative for behavioral problems, confusion, sleep disturbance and agitation.    Allergies  Penicillins and Shellfish allergy  Home Medications   Prior to Admission medications   Medication Sig Start Date End Date Taking? Authorizing Provider  levonorgestrel-ethinyl estradiol (SEASONALE,INTROVALE,JOLESSA) 0.15-0.03 MG tablet Take 1 tablet by mouth daily.   Yes Historical Provider, MD  SUMAtriptan (IMITREX) 100 MG tablet Take 100 mg by mouth every 2 (two) hours as needed for migraine. May repeat in 2 hours if headache persists or recurs.   Yes Historical Provider, MD  acetaminophen (TYLENOL) 325 MG tablet Take 650 mg by mouth every 6 (six) hours as needed.    Historical Provider, MD  acetaminophen (TYLENOL) 500 MG tablet Take 1 tablet (500 mg total) by mouth every 6 (six) hours as needed. 09/19/15   Barbaraann Barthel, NP  doxycycline (VIBRAMYCIN) 100 MG capsule Take 1 capsule (100 mg total) by mouth 2 (two) times daily. 09/19/15   Barbaraann Barthel, NP  famotidine (PEPCID) 20 MG tablet Take 1 tablet (20 mg total) by mouth 2 (two) times daily. 06/19/15   Domenick Gong, MD  fluconazole (DIFLUCAN) 150 MG tablet Take 1 tablet (150 mg total) by mouth daily. 09/19/15   Barbaraann Barthel, NP  fluticasone (FLONASE) 50 MCG/ACT nasal spray Place 1 spray into both nostrils 2 (two) times daily. 09/19/15 10/03/15  Barbaraann Barthel, NP  loratadine (ALLERGY) 10 MG tablet Take 10 mg by mouth daily.    Historical Provider, MD  meclizine (ANTIVERT) 25 MG tablet Take 1 tablet (25 mg total) by mouth 3 (three) times daily as needed for dizziness. 09/19/15   Barbaraann Barthel, NP  sodium chloride (OCEAN) 0.65 % SOLN nasal spray  Place 2 sprays into both nostrils every 2 (two) hours while awake. 09/19/15   Barbaraann Barthel, NP   Meds Ordered and Administered this Visit  Medications - No data to display  BP 156/94 mmHg  Pulse 76  Temp(Src) 97.9 F (36.6 C) (Tympanic)  Resp 16  SpO2 100% No data found.   Physical Exam  Constitutional: She is oriented to person, place, and time. She appears well-developed and well-nourished. She is active and cooperative.  Non-toxic appearance. She does not have a sickly appearance. She appears ill. No distress.  HENT:  Head: Normocephalic and atraumatic.  Right Ear: Hearing, external ear and ear canal normal. A middle ear effusion is present.  Left Ear: Hearing, external ear and ear canal normal. A middle ear effusion is present.  Nose: Mucosal edema and rhinorrhea present. No nose lacerations, sinus tenderness, nasal deformity, septal deviation or nasal septal hematoma. No epistaxis.  No foreign bodies. Right sinus exhibits maxillary sinus tenderness. Right sinus exhibits no frontal sinus tenderness. Left sinus exhibits maxillary sinus tenderness. Left sinus exhibits no frontal sinus tenderness.  Mouth/Throat: Uvula is midline and mucous membranes are normal. Mucous membranes are not pale, not dry and not cyanotic. She does not have dentures. No oral lesions. No trismus in the  jaw. Normal dentition. No dental abscesses, uvula swelling, lacerations or dental caries. Posterior oropharyngeal edema and posterior oropharyngeal erythema present. No oropharyngeal exudate or tonsillar abscesses.  Bilateral TMs with air fluid level slight opacity; cobblestoning posterior pharynx; bilateral turbinates with edema/ertyema clear discharge; allergic shiners no nystagmus ambulates without difficulty in clinic  Eyes: Conjunctivae, EOM and lids are normal. Pupils are equal, round, and reactive to light. Right eye exhibits no chemosis, no discharge, no exudate and no hordeolum. No foreign body present  in the right eye. Left eye exhibits no chemosis, no discharge, no exudate and no hordeolum. No foreign body present in the left eye. Right conjunctiva is not injected. Right conjunctiva has no hemorrhage. Left conjunctiva is not injected. Left conjunctiva has no hemorrhage. No scleral icterus. Right eye exhibits normal extraocular motion and no nystagmus. Left eye exhibits normal extraocular motion and no nystagmus. Right pupil is round and reactive. Left pupil is round and reactive. Pupils are equal.  Neck: Trachea normal and normal range of motion. Neck supple. No tracheal tenderness, no spinous process tenderness and no muscular tenderness present. No rigidity. No tracheal deviation, no edema, no erythema and normal range of motion present. No thyroid mass and no thyromegaly present.  Cardiovascular: Normal rate, regular rhythm, S1 normal, S2 normal, normal heart sounds and intact distal pulses.  PMI is not displaced.  Exam reveals no gallop and no friction rub.   No murmur heard. Pulses:      Radial pulses are 2+ on the right side, and 2+ on the left side.  Pulmonary/Chest: Effort normal and breath sounds normal. No accessory muscle usage or stridor. No respiratory distress. She has no decreased breath sounds. She has no wheezes. She has no rhonchi. She has no rales. She exhibits no tenderness.  Abdominal: Soft. Bowel sounds are normal. She exhibits no shifting dullness, no distension, no pulsatile liver, no fluid wave, no abdominal bruit, no ascites, no pulsatile midline mass and no mass. There is no hepatosplenomegaly. There is no tenderness. There is no rigidity, no rebound, no guarding, no tenderness at McBurney's point and negative Murphy's sign. Hernia confirmed negative in the ventral area.  Dull to percussion x 4 quads  Musculoskeletal: Normal range of motion. She exhibits no edema or tenderness.       Right shoulder: Normal.       Left shoulder: Normal.       Right hip: Normal.       Left  hip: Normal.       Right knee: Normal.       Left knee: Normal.       Cervical back: Normal.       Right hand: Normal.       Left hand: Normal.  Equal grip strength all extremities 5/5 strength  Lymphadenopathy:       Head (right side): No submental, no submandibular, no tonsillar, no preauricular, no posterior auricular and no occipital adenopathy present.       Head (left side): No submental, no submandibular, no tonsillar, no preauricular, no posterior auricular and no occipital adenopathy present.    She has no cervical adenopathy.       Right cervical: No superficial cervical, no deep cervical and no posterior cervical adenopathy present.      Left cervical: No superficial cervical, no deep cervical and no posterior cervical adenopathy present.  Neurological: She is alert and oriented to person, place, and time. She has normal strength and normal reflexes. She is not  disoriented. She displays no atrophy, no tremor and normal reflexes. No cranial nerve deficit or sensory deficit. She exhibits normal muscle tone. She displays no seizure activity. Coordination and gait normal. GCS eye subscore is 4. GCS verbal subscore is 5. GCS motor subscore is 6.  Skin: Skin is warm, dry and intact. No abrasion, no bruising, no burn, no ecchymosis, no laceration, no lesion, no petechiae and no rash noted. She is not diaphoretic. No cyanosis or erythema. No pallor. Nails show no clubbing.  Psychiatric: She has a normal mood and affect. Her speech is normal and behavior is normal. Judgment and thought content normal. Cognition and memory are normal.  Nursing note and vitals reviewed.   ED Course  Procedures (including critical care time)  Labs Review Labs Reviewed - No data to display  Imaging Review No results found.     MDM   1. Acute recurrent maxillary sinusitis   2. Otitis media with effusion, bilateral   3. Vertigo    Discussed with patient otitis media with effusion probably causing  vertigo but could also be age.  Wants to do trial of meclizine at home has work meeting soon she cannot miss and has to drive herself.  Supportive treatment may take up to 4 doses meclizine per day max 100mg  per 24 hours.  Discussed signs/symptoms stroke.  Avoid driving  during vertigo episodes.  Follow up if aphasia, dysphasia, visual changes, weakness, fall, worst headache of life, incoordination at ER tonight.  PCM or [facility] if fever, ear discharge.  Discussed with age otolith dysfunction can occur.  Consider ENT evaluation/follow up with PCM if worsening symptoms not controlled with meclizine or needs Rx refill.  Patient verbalized understanding of information/agreed with plan of care and had no further questions at this time.  Supportive treatment.   No evidence of invasive bacterial infection, non toxic and well hydrated.  This is most likely self limiting viral infection.  I do not see where any further testing or imaging is necessary at this time.   I will suggest supportive care, rest, good hygiene and encourage the patient to take adequate fluids.  The patient is to return to clinic or EMERGENCY ROOM if symptoms worsen or change significantly e.g. ear pain, fever, purulent discharge from ears or bleeding.  Exitcare handout on otitis media with effusion given to patient.  Patient verbalized agreement and understanding of treatment plan.    flonase 1 spray each nostril BID, saline 2 sprays each nostril q2h prn congestion if no relief with 48-72 hours of nasal sprays then start doxycycline 100mg  po BID x 10 days Rx given.  Patient typically gets yeast infection with antibiotics Rx Diflucan 150mg  po x 1 given to patient. Discussed to use back up birth control if she takes doxycycline.  Tylenol 1000mg  po QID prn headache/pain.  Hydrate.   No evidence of bacterial infection, non toxic and well hydrated.  I do not see where any further testing or imaging is necessary at this time.   I will suggest supportive  care, rest, good hygiene and encourage the patient to take adequate fluids.  The patient is to return to clinic or EMERGENCY ROOM if symptoms worsen or change significantly.  Exitcare handout on sinusitis given to patient.  Patient verbalized agreement and understanding of treatment plan and had no further questions at this time.   P2:  Hand washing and cover cough    Barbaraann Barthel, NP 09/20/15 1647

## 2015-10-24 NOTE — ED Notes (Signed)
Entered in error  Payton Mccallumrlando Markelle Najarian, MD 10/24/15 1135

## 2016-04-11 ENCOUNTER — Ambulatory Visit
Admission: EM | Admit: 2016-04-11 | Discharge: 2016-04-11 | Disposition: A | Discharge status: Home / Self Care | Payer: Managed Care, Other (non HMO) | Attending: Family Medicine | Admitting: Family Medicine

## 2016-04-11 ENCOUNTER — Encounter: Payer: Self-pay | Admitting: Emergency Medicine

## 2016-04-11 DIAGNOSIS — N390 Urinary tract infection, site not specified: Secondary | ICD-10-CM

## 2016-04-11 LAB — URINALYSIS COMPLETE WITH MICROSCOPIC (ARMC ONLY)
Bilirubin Urine: NEGATIVE
GLUCOSE, UA: NEGATIVE mg/dL
KETONES UR: NEGATIVE mg/dL
Nitrite: POSITIVE — AB
Protein, ur: NEGATIVE mg/dL
Specific Gravity, Urine: <1.005 — ABNORMAL LOW (ref 1.005–1.030)
pH: 5 (ref 5.0–8.0)

## 2016-04-11 MED ORDER — NITROFURANTOIN MONOHYD MACRO 100 MG PO CAPS: 100.0000 mg | ORAL_CAPSULE | Freq: Two times a day (BID) | ORAL | Status: DC

## 2016-04-11 MED ORDER — FLUCONAZOLE 150 MG PO TABS: 150.0000 mg | ORAL_TABLET | Freq: Every day | ORAL | Status: DC

## 2016-04-11 NOTE — ED Provider Notes (Signed)
[facility]  ____________________________________________  Time seen: Approximately 1:41 PM  I have reviewed the triage vital signs and the nursing notes.   HISTORY  Chief Complaint Dysuria  HPI Alyssa Valencia is a 45 y.o. female  presents for the complaints of 2 days of urinary frequency, urinary urgency and burning with urination. Patient states pain and burning is only with urination. Patient reports she does have a history of urinary tract infections that feels similar. Patient [place] Patient reports she was in out of pools as well as did have more sexual encounters with her husband then normal. Reports showering after swimming and voiding post sexual intercourse. States mild accompanying mid suprapubic pressure but denies pain. Reports continues to eat and drink well. Reports last urinary tract infection was greater than 6 months ago. Patient was that she did take 2 doses of over-the-counter Azo which did resolve burning sensation temporarily.  Denies vaginal complaints, vaginal discharge, vaginal odor or vaginal pain. Denies other abdominal pain or discomfort. Denies fevers, nausea, vomiting, diarrhea, weakness, dizziness, chest pain, shortness of breath, or recent sickness.  PCP: [facility]  No LMP recorded. Patient is not currently having periods (Reason: Oral contraceptives). Denies chance of pregnancy.   Past Medical History  Diagnosis Date  . Patient denies medical problems   . UTI (lower urinary tract infection)     There are no active problems to display for this patient.   Past Surgical History  Procedure Laterality Date  . No past surgeries      Current Outpatient Rx  Name  Route  Sig  Dispense  Refill  .           .           .           .           .           .           . levonorgestrel-ethinyl estradiol (SEASONALE,INTROVALE,JOLESSA) 0.15-0.03 MG tablet   Oral   Take 1 tablet by mouth daily.         .           .           .           .           Marland Kitchen. SUMAtriptan (IMITREX) 100 MG tablet   Oral   Take 100 mg by mouth every 2 (two) hours as needed for migraine. May repeat in 2 hours if headache persists or recurs.           Allergies Penicillins and Shellfish allergy  Family History  Problem Relation Age of Onset  . Irritable bowel syndrome Mother   . Nephrolithiasis Mother   . Heart attack Father     Social History Social History  Substance Use Topics  . Smoking status: Former Smoker    Quit date: 11/04/1985  . Smokeless tobacco: Never Used  . Alcohol Use: Yes     Comment: occasionally    Review of Systems Constitutional: No fever/chills Eyes: No visual changes. ENT: No sore throat. Cardiovascular: Denies chest pain. Respiratory: Denies shortness of breath. Gastrointestinal: No abdominal pain.  No nausea, no vomiting.  No diarrhea.  No constipation. Genitourinary: Positive for dysuria. Musculoskeletal: Negative for back pain. Skin: Negative for rash. Neurological: Negative for headaches, focal weakness  or numbness.  10-point ROS otherwise negative.  ____________________________________________   PHYSICAL EXAM:  VITAL SIGNS: ED Triage Vitals  Enc Vitals Group     BP 04/11/16 1302 141/88 mmHg     Pulse Rate 04/11/16 1302 75     Resp 04/11/16 1302 16     Temp 04/11/16 1302 97.8 F (36.6 C)     Temp Source 04/11/16 1302 Tympanic     SpO2 04/11/16 1302 99 %     Weight 04/11/16 1302 142 lb (64.411 kg)     Height 04/11/16 1302 5\' 4"  (1.626 m)     Head Cir --      Peak Flow --      Pain Score --      Pain Loc --      Pain Edu? --      Excl. in GC? --     Constitutional: Alert and oriented. Well appearing and in no acute distress. Eyes: Conjunctivae are normal. PERRL. EOMI. Head: Atraumatic.  Mouth/Throat: Mucous membranes are moist.  Oropharynx non-erythematous. Neck: No stridor.  No cervical spine tenderness to palpation. Cardiovascular:  Normal rate, regular rhythm. Grossly normal heart sounds.  Good peripheral circulation. Respiratory: Normal respiratory effort.  No retractions. Lungs CTAB.  Gastrointestinal: Soft and nontender. No distention.No CVA tenderness. Musculoskeletal: No lower or upper extremity tenderness nor edema.  No cervical, thoracic or lumbar tenderness to palpation. Neurologic:  Normal speech and language. No gross focal neurologic deficits are appreciated. No gait instability. Skin:  Skin is warm, dry and intact. No rash noted. Psychiatric: Mood and affect are normal. Speech and behavior are normal.  ____________________________________________   LABS (all labs ordered are listed, but only abnormal results are displayed)  Labs Reviewed  URINALYSIS COMPLETEWITH MICROSCOPIC ([facility] ONLY) - Abnormal; Notable for the following:    Color, Urine ORANGE (*)    Specific Gravity, Urine <1.005 (*)    Hgb urine dipstick 2+ (*)    Nitrite POSITIVE (*)    Leukocytes, UA 3+ (*)    Bacteria, UA MANY (*)    Squamous Epithelial / LPF 0-5 (*)    All other components within normal limits     INITIAL IMPRESSION / ASSESSMENT AND PLAN / ED COURSE  Pertinent labs & imaging results that were available during my care of the patient were reviewed by me and considered in my medical decision making (see chart for details).  Very well-appearing patient. No acute distress. Presenting for urinary frequency, urinary urgency and burning with urination over the last 2 days. Abdomen soft and nontender. Urinalysis results discussed with patient. Will treat UTI with oral Macrobid. Patient requests prescription for oral Diflucan, 1 tab Rx given. Encouraged rest, fluids and PCP follow-up as needed.Discussed indication, risks and benefits of medications with patient.  Discussed follow up with Primary care physician this week. Discussed follow up and return parameters including no resolution or any worsening concerns. Patient verbalized  understanding and agreed to plan.   ____________________________________________   FINAL CLINICAL IMPRESSION(S) / ED DIAGNOSES  Final diagnoses:  UTI (lower urinary tract infection)     Discharge Medication List as of 04/11/2016  1:32 PM    START taking these medications   Details  nitrofurantoin, macrocrystal-monohydrate, (MACROBID) 100 MG capsule Take 1 capsule (100 mg total) by mouth 2 (two) times daily., Starting 04/11/2016, Until Discontinued, Normal        Note: This dictation was prepared with Dragon dictation along with smaller phrase technology. Any transcriptional errors that result from  this process are unintentional.       Renford Dills, NP 04/11/16 1354

## 2016-04-11 NOTE — ED Notes (Signed)
Pt reports polyuria and dysuria that started yesterday. Pt reports improvement today after taking AZO OTC yesterday

## 2016-04-11 NOTE — Discharge Instructions (Signed)
Take medication as prescribed. Rest. Drink plenty of fluids.  ° °Follow up with your primary care physician this week as needed. Return to Urgent care for new or worsening concerns.  ° ° °Urinary Tract Infection °Urinary tract infections (UTIs) can develop anywhere along your urinary tract. Your urinary tract is your body's drainage system for removing wastes and extra water. Your urinary tract includes two kidneys, two ureters, a bladder, and a urethra. Your kidneys are a pair of bean-shaped organs. Each kidney is about the size of your fist. They are located below your ribs, one on each side of your spine. °CAUSES °Infections are caused by microbes, which are microscopic organisms, including fungi, viruses, and bacteria. These organisms are so small that they can only be seen through a microscope. Bacteria are the microbes that most commonly cause UTIs. °SYMPTOMS  °Symptoms of UTIs may vary by age and gender of the patient and by the location of the infection. Symptoms in young women typically include a frequent and intense urge to urinate and a painful, burning feeling in the bladder or urethra during urination. Older women and men are more likely to be tired, shaky, and weak and have muscle aches and abdominal pain. A fever may mean the infection is in your kidneys. Other symptoms of a kidney infection include pain in your back or sides below the ribs, nausea, and vomiting. °DIAGNOSIS °To diagnose a UTI, your caregiver will ask you about your symptoms. Your caregiver will also ask you to provide a urine sample. The urine sample will be tested for bacteria and white blood cells. White blood cells are made by your body to help fight infection. °TREATMENT  °Typically, UTIs can be treated with medication. Because most UTIs are caused by a bacterial infection, they usually can be treated with the use of antibiotics. The choice of antibiotic and length of treatment depend on your symptoms and the type of bacteria  causing your infection. °HOME CARE INSTRUCTIONS °· If you were prescribed antibiotics, take them exactly as your caregiver instructs you. Finish the medication even if you feel better after you have only taken some of the medication. °· Drink enough water and fluids to keep your urine clear or pale yellow. °· Avoid caffeine, tea, and carbonated beverages. They tend to irritate your bladder. °· Empty your bladder often. Avoid holding urine for long periods of time. °· Empty your bladder before and after sexual intercourse. °· After a bowel movement, women should cleanse from front to back. Use each tissue only once. °SEEK MEDICAL CARE IF:  °· You have back pain. °· You develop a fever. °· Your symptoms do not begin to resolve within 3 days. °SEEK IMMEDIATE MEDICAL CARE IF:  °· You have severe back pain or lower abdominal pain. °· You develop chills. °· You have nausea or vomiting. °· You have continued burning or discomfort with urination. °MAKE SURE YOU:  °· Understand these instructions. °· Will watch your condition. °· Will get help right away if you are not doing well or get worse. °  °This information is not intended to replace advice given to you by your health care provider. Make sure you discuss any questions you have with your health care provider. °  °Document Released: 08/01/2005 Document Revised: 07/13/2015 Document Reviewed: 11/30/2011 °Elsevier Interactive Patient Education ©2016 Elsevier Inc. ° °

## 2016-05-02 ENCOUNTER — Ambulatory Visit
Admission: EM | Admit: 2016-05-02 | Discharge: 2016-05-02 | Disposition: A | Discharge status: Home / Self Care | Payer: Managed Care, Other (non HMO) | Attending: Family Medicine | Admitting: Family Medicine

## 2016-05-02 DIAGNOSIS — N390 Urinary tract infection, site not specified: Secondary | ICD-10-CM | POA: Diagnosis not present

## 2016-05-02 DIAGNOSIS — R300 Dysuria: Secondary | ICD-10-CM | POA: Diagnosis not present

## 2016-05-02 LAB — URINALYSIS COMPLETE WITH MICROSCOPIC (ARMC ONLY)
BILIRUBIN URINE: NEGATIVE
GLUCOSE, UA: NEGATIVE mg/dL
Ketones, ur: NEGATIVE mg/dL
Nitrite: POSITIVE — AB
Protein, ur: NEGATIVE mg/dL
Specific Gravity, Urine: 1.02 (ref 1.005–1.030)
pH: 5 (ref 5.0–8.0)

## 2016-05-02 MED ORDER — CIPROFLOXACIN HCL 500 MG PO TABS: 500.0000 mg | ORAL_TABLET | Freq: Two times a day (BID) | ORAL | Status: DC

## 2016-05-02 MED ORDER — FLUCONAZOLE 150 MG PO TABS: 150.0000 mg | ORAL_TABLET | Freq: Every day | ORAL | Status: DC

## 2016-05-02 NOTE — ED Provider Notes (Signed)
[facility]  ____________________________________________  Time seen: Approximately 3:50 PM  I have reviewed the triage vital signs and the nursing notes.   HISTORY  Chief Complaint Urinary Tract Infection   HPI Alyssa Valencia is a 45 y.o. female presents with a complaint of urinary frequency, urinary urgency and some burning with urination. Patient states she had a urinary tract infection approximately 3 weeks ago and was treated with oral antibiotics. Patient states that she did well with antibiotics and states that her symptoms fully resolved for several days. Patient reports that her symptoms of an present on last 2-3 days. Patient denies known triggers. Patient reports sexually active with 1 partner. Denies chance of pregnancy. Denies concerns of STDs. Denies vaginal complaints, vaginal discharge, vaginal odor or pelvic pain. Reports normal bowel movements. Denies any other recent sickness.  Denies fevers, chest pain, shortness of breath, abdominal pain, back pain, extremity pain or extremity swelling.  No LMP recorded. Patient is not currently having periods (Reason: Oral contraceptives). Patient reports that she is on a 3 month oral contraceptive. Patient reports she did have a regular period last month. Denies chance of pregnancy.  [facility] primary care   Past Medical History  Diagnosis Date  . Patient denies medical problems   . UTI (lower urinary tract infection)     There are no active problems to display for this patient.   Past Surgical History  Procedure Laterality Date  . No past surgeries      Current Outpatient Rx  Name  Route  Sig  Dispense  Refill  . acetaminophen (TYLENOL) 325 MG tablet   Oral   Take 650 mg by mouth every 6 (six) hours as needed.         . famotidine (PEPCID) 20 MG tablet   Oral   Take 1 tablet (20 mg total) by mouth 2 (two) times daily.   10 tablet   0   . levonorgestrel-ethinyl estradiol  (SEASONALE,INTROVALE,JOLESSA) 0.15-0.03 MG tablet   Oral   Take 1 tablet by mouth daily.         Marland Kitchen acetaminophen (TYLENOL) 500 MG tablet   Oral   Take 1 tablet (500 mg total) by mouth every 6 (six) hours as needed.   30 tablet   0   . doxycycline (VIBRAMYCIN) 100 MG capsule   Oral   Take 1 capsule (100 mg total) by mouth 2 (two) times daily.   20 capsule   0   . fluconazole (DIFLUCAN) 150 MG tablet   Oral   Take 1 tablet (150 mg total) by mouth daily. Take one pill orally, then Repeat in one week as needed.   1 tablet   0   . EXPIRED: fluticasone (FLONASE) 50 MCG/ACT nasal spray   Each Nare   Place 1 spray into both nostrils 2 (two) times daily.   16 g   0   . loratadine (ALLERGY) 10 MG tablet   Oral   Take 10 mg by mouth daily.         . meclizine (ANTIVERT) 25 MG tablet   Oral   Take 1 tablet (25 mg total) by mouth 3 (three) times daily as needed for dizziness.   30 tablet   0   .           . sodium chloride (OCEAN) 0.65 % SOLN nasal spray   Each Nare   Place 2 sprays into both nostrils every 2 (two) hours while awake.  0   . SUMAtriptan (IMITREX) 100 MG tablet   Oral   Take 100 mg by mouth every 2 (two) hours as needed for migraine. May repeat in 2 hours if headache persists or recurs.           Allergies Penicillins and Shellfish allergy  Family History  Problem Relation Age of Onset  . Irritable bowel syndrome Mother   . Nephrolithiasis Mother   . Heart attack Father     Social History Social History  Substance Use Topics  . Smoking status: Former Smoker    Quit date: 11/04/1985  . Smokeless tobacco: Never Used  . Alcohol Use: Yes     Comment: occasionally    Review of Systems Constitutional: No fever/chills Eyes: No visual changes. ENT: No sore throat. Cardiovascular: Denies chest pain. Respiratory: Denies shortness of breath. Gastrointestinal: No abdominal pain.  No nausea, no vomiting.  No diarrhea.  No  constipation. Genitourinary: positive for dysuria. Musculoskeletal: Negative for back pain. Skin: Negative for rash. Neurological: Negative for headaches, focal weakness or numbness.  10-point ROS otherwise negative.  ____________________________________________   PHYSICAL EXAM:  VITAL SIGNS: ED Triage Vitals  Enc Vitals Group     BP 05/02/16 1506 136/87 mmHg     Pulse Rate 05/02/16 1506 56     Resp 05/02/16 1506 16     Temp 05/02/16 1506 97.8 F (36.6 C)     Temp Source 05/02/16 1506 Oral     SpO2 05/02/16 1506 100 %     Weight 05/02/16 1506 141 lb (63.957 kg)     Height 05/02/16 1506 5\' 4"  (1.626 m)     Head Cir --      Peak Flow --      Pain Score 05/02/16 1510 6     Pain Loc --      Pain Edu? --      Excl. in GC? --     Constitutional: Alert and oriented. Well appearing and in no acute distress. Eyes: Conjunctivae are normal. PERRL. EOMI. Head: Atraumatic.  Mouth/Throat: Mucous membranes are moist.  Oropharynx non-erythematous. Neck: No stridor.  No cervical spine tenderness to palpation. Cardiovascular: Normal rate, regular rhythm. Grossly normal heart sounds.  Good peripheral circulation. Respiratory: Normal respiratory effort.  No retractions. Lungs CTAB. Gastrointestinal: Soft and nontender. No distention. Normal Bowel sounds. No CVA tenderness. Musculoskeletal: No lower or upper extremity tenderness nor edema.   Neurologic:  Normal speech and language. No gross focal neurologic deficits are appreciated. No gait instability. Skin:  Skin is warm, dry and intact. No rash noted. Psychiatric: Mood and affect are normal. Speech and behavior are normal.  ____________________________________________   LABS (all labs ordered are listed, but only abnormal results are displayed)  Labs Reviewed  URINALYSIS COMPLETEWITH MICROSCOPIC ([facility] ONLY) - Abnormal; Notable for the following:    APPearance CLOUDY (*)    Hgb urine dipstick 2+ (*)    Nitrite POSITIVE (*)     Leukocytes, UA 1+ (*)    Bacteria, UA MANY (*)    Squamous Epithelial / LPF 6-30 (*)    All other components within normal limits  URINE CULTURE    INITIAL IMPRESSION / ASSESSMENT AND PLAN / ED COURSE  Pertinent labs & imaging results that were available during my care of the patient were reviewed by me and considered in my medical decision making (see chart for details).  Very well-appearing patient. No acute distress. Presents for the complaints of urinary frequency, urgency and dysuria.  Denies other complaints. Abdomen soft and nontender. Lungs clear throughout. Urinalysis reviewed. Will treat urinary tract infection with oral Cipro due to recent antibiotic use. Will culture urine. Encourage PCP follow up in one week to ensure clearance. Discuss sooner return parameters. Encouraged rest, fluids.  Discussed follow up with Primary care physician this week. Discussed follow up and return parameters including no resolution or any worsening concerns. Patient verbalized understanding and agreed to plan.   ____________________________________________   FINAL CLINICAL IMPRESSION(S) / ED DIAGNOSES  Final diagnoses:  UTI (lower urinary tract infection)  Dysuria     Discharge Medication List as of 05/02/2016  3:52 PM    START taking these medications   Details  ciprofloxacin (CIPRO) 500 MG tablet Take 1 tablet (500 mg total) by mouth 2 (two) times daily., Starting 05/02/2016, Until Discontinued, Normal        Note: This dictation was prepared with Dragon dictation along with smaller phrase technology. Any transcriptional errors that result from this process are unintentional.       Renford DillsLindsey Delando Satter, NP 05/02/16 [phone]

## 2016-05-02 NOTE — Discharge Instructions (Signed)
Take medication as prescribed. Rest. Drink plenty of fluids.   Follow up with your primary care physician this week as needed. Return to Urgent care for new or worsening concerns.    Dysuria Dysuria is pain or discomfort while urinating. The pain or discomfort may be felt in the tube that carries urine out of the bladder (urethra) or in the surrounding tissue of the genitals. The pain may also be felt in the groin area, lower abdomen, and lower back. You may have to urinate frequently or have the sudden feeling that you have to urinate (urgency). Dysuria can affect both men and women, but is more common in women. Dysuria can be caused by many different things, including:  Urinary tract infection in women.  Infection of the kidney or bladder.  Kidney stones or bladder stones.  Certain sexually transmitted infections (STIs), such as chlamydia.  Dehydration.  Inflammation of the vagina.  Use of certain medicines.  Use of certain soaps or scented products that cause irritation. HOME CARE INSTRUCTIONS Watch your dysuria for any changes. The following actions may help to reduce any discomfort you are feeling:  Drink enough fluid to keep your urine clear or pale yellow.  Empty your bladder often. Avoid holding urine for long periods of time.  After a bowel movement or urination, women should cleanse from front to back, using each tissue only once.  Empty your bladder after sexual intercourse.  Take medicines only as directed by your health care provider.  If you were prescribed an antibiotic medicine, finish it all even if you start to feel better.  Avoid caffeine, tea, and alcohol. They can irritate the bladder and make dysuria worse. In men, alcohol may irritate the prostate.  Keep all follow-up visits as directed by your health care provider. This is important.  If you had any tests done to find the cause of dysuria, it is your responsibility to obtain your test results. Ask  the lab or department performing the test when and how you will get your results. Talk with your health care provider if you have any questions about your results. SEEK MEDICAL CARE IF:  You develop pain in your back or sides.  You have a fever.  You have nausea or vomiting.  You have blood in your urine.  You are not urinating as often as you usually do. SEEK IMMEDIATE MEDICAL CARE IF:  You pain is severe and not relieved with medicines.  You are unable to hold down any fluids.  You or someone else notices a change in your mental function.  You have a rapid heartbeat at rest.  You have shaking or chills.  You feel extremely weak.   This information is not intended to replace advice given to you by your health care provider. Make sure you discuss any questions you have with your health care provider.   Document Released: 07/20/2004 Document Revised: 11/12/2014 Document Reviewed: 06/17/2014 Elsevier Interactive Patient Education 2016 Elsevier Inc.  Urinary Tract Infection Urinary tract infections (UTIs) can develop anywhere along your urinary tract. Your urinary tract is your body's drainage system for removing wastes and extra water. Your urinary tract includes two kidneys, two ureters, a bladder, and a urethra. Your kidneys are a pair of bean-shaped organs. Each kidney is about the size of your fist. They are located below your ribs, one on each side of your spine. CAUSES Infections are caused by microbes, which are microscopic organisms, including fungi, viruses, and bacteria. These organisms are  so small that they can only be seen through a microscope. Bacteria are the microbes that most commonly cause UTIs. SYMPTOMS  Symptoms of UTIs may vary by age and gender of the patient and by the location of the infection. Symptoms in young women typically include a frequent and intense urge to urinate and a painful, burning feeling in the bladder or urethra during urination. Older  women and men are more likely to be tired, shaky, and weak and have muscle aches and abdominal pain. A fever may mean the infection is in your kidneys. Other symptoms of a kidney infection include pain in your back or sides below the ribs, nausea, and vomiting. DIAGNOSIS To diagnose a UTI, your caregiver will ask you about your symptoms. Your caregiver will also ask you to provide a urine sample. The urine sample will be tested for bacteria and white blood cells. White blood cells are made by your body to help fight infection. TREATMENT  Typically, UTIs can be treated with medication. Because most UTIs are caused by a bacterial infection, they usually can be treated with the use of antibiotics. The choice of antibiotic and length of treatment depend on your symptoms and the type of bacteria causing your infection. HOME CARE INSTRUCTIONS  If you were prescribed antibiotics, take them exactly as your caregiver instructs you. Finish the medication even if you feel better after you have only taken some of the medication.  Drink enough water and fluids to keep your urine clear or pale yellow.  Avoid caffeine, tea, and carbonated beverages. They tend to irritate your bladder.  Empty your bladder often. Avoid holding urine for long periods of time.  Empty your bladder before and after sexual intercourse.  After a bowel movement, women should cleanse from front to back. Use each tissue only once. SEEK MEDICAL CARE IF:   You have back pain.  You develop a fever.  Your symptoms do not begin to resolve within 3 days. SEEK IMMEDIATE MEDICAL CARE IF:   You have severe back pain or lower abdominal pain.  You develop chills.  You have nausea or vomiting.  You have continued burning or discomfort with urination. MAKE SURE YOU:   Understand these instructions.  Will watch your condition.  Will get help right away if you are not doing well or get worse.   This information is not intended to  replace advice given to you by your health care provider. Make sure you discuss any questions you have with your health care provider.   Document Released: 08/01/2005 Document Revised: 07/13/2015 Document Reviewed: 11/30/2011 Elsevier Interactive Patient Education [employer]2016 Else[employer] Inc.

## 2016-05-02 NOTE — ED Notes (Signed)
Patient states that she was here two weeks ago for a UTI. Patient states that she improved and then worsened 2 days ago. Patient states that symptoms are urinary urgency, frequency and painful urination.

## 2016-05-05 LAB — URINE CULTURE: Special Requests: NORMAL

## 2016-07-04 ENCOUNTER — Ambulatory Visit
Admission: EM | Admit: 2016-07-04 | Discharge: 2016-07-04 | Disposition: A | Discharge status: Home / Self Care | Payer: Managed Care, Other (non HMO) | Attending: Family Medicine | Admitting: Family Medicine

## 2016-07-04 DIAGNOSIS — J0100 Acute maxillary sinusitis, unspecified: Secondary | ICD-10-CM

## 2016-07-04 DIAGNOSIS — G43009 Migraine without aura, not intractable, without status migrainosus: Secondary | ICD-10-CM | POA: Diagnosis not present

## 2016-07-04 MED ORDER — FLUCONAZOLE 150 MG PO TABS: 150.0000 mg | ORAL_TABLET | Freq: Every day | ORAL | 0 refills | Status: DC

## 2016-07-04 MED ORDER — KETOROLAC TROMETHAMINE 60 MG/2ML IM SOLN
60.0000 mg | Freq: Once | INTRAMUSCULAR | Status: AC
  Administered 2016-07-04: 60 mg via INTRAMUSCULAR

## 2016-07-04 MED ORDER — AZITHROMYCIN 250 MG PO TABS: ORAL_TABLET | ORAL | 0 refills | Status: DC

## 2016-07-04 NOTE — ED Provider Notes (Signed)
[facility]    CSN: [id number] Arrival date & time: 07/04/16  1191  First Provider Contact:  First MD Initiated Contact with Patient 07/04/16 [phone]        History   Chief Complaint Chief Complaint  Patient presents with  . Recurrent Sinusitis    HPI Alyssa Valencia is a 45 y.o. female.   The history is provided by the patient.  URI  Presenting symptoms: congestion, ear pain and facial pain   Severity:  Moderate Onset quality:  Sudden Duration:  5 days Timing:  Constant Progression:  Worsening Chronicity:  New Relieved by:  Nothing Ineffective treatments:  OTC medications Associated symptoms: sinus pain   Associated symptoms: no wheezing   Risk factors: not elderly, no chronic cardiac disease, no chronic kidney disease, no chronic respiratory disease, no diabetes mellitus, no immunosuppression, no recent illness and no recent travel    Patient complains of earache, migraine, congestion that started about 4-5 days ago. She has tried OTC migraine medicine and tylenol sinus congestion, with some relief but it always comes back when it wears off. Headache sensitive to light and has had some nausea.   Past Medical History:  Diagnosis Date  . Patient denies medical problems   . UTI (lower urinary tract infection)     There are no active problems to display for this patient.   Past Surgical History:  Procedure Laterality Date  . NO PAST SURGERIES      OB History    Gravida Para Term Preterm AB Living   1 0     1     SAB TAB Ectopic Multiple Live Births                   Home Medications    Prior to Admission medications   Medication Sig Start Date End Date Taking? Authorizing Provider  acetaminophen (TYLENOL) 325 MG tablet Take 650 mg by mouth every 6 (six) hours as needed.   Yes Historical Provider, MD  famotidine (PEPCID) 20 MG tablet Take 1 tablet (20 mg total) by mouth 2 (two) times daily. 06/19/15  Yes Domenick Gong, MD  levonorgestrel-ethinyl  estradiol (SEASONALE,INTROVALE,JOLESSA) 0.15-0.03 MG tablet Take 1 tablet by mouth daily.   Yes Historical Provider, MD  loratadine (ALLERGY) 10 MG tablet Take 10 mg by mouth daily.   Yes Historical Provider, MD  acetaminophen (TYLENOL) 500 MG tablet Take 1 tablet (500 mg total) by mouth every 6 (six) hours as needed. 09/19/15   Barbaraann Barthel, NP  azithromycin (ZITHROMAX Z-PAK) 250 MG tablet 2 tabs po once day 1, then 1 tab po qd for next 4 days 07/04/16   Payton Mccallum, MD  fluconazole (DIFLUCAN) 150 MG tablet Take 1 tablet (150 mg total) by mouth daily. 07/04/16   Payton Mccallum, MD  fluticasone (FLONASE) 50 MCG/ACT nasal spray Place 1 spray into both nostrils 2 (two) times daily. 09/19/15 10/03/15  Barbaraann Barthel, NP    Family History Family History  Problem Relation Age of Onset  . Irritable bowel syndrome Mother   . Nephrolithiasis Mother   . Heart attack Father     Social History Social History  Substance Use Topics  . Smoking status: Former Smoker    Quit date: 11/04/1985  . Smokeless tobacco: Never Used  . Alcohol use Yes     Comment: occasionally     Allergies   Penicillins and Shellfish allergy   Review of Systems Review of Systems  HENT: Positive for  congestion and ear pain.   Respiratory: Negative for wheezing.      Physical Exam Triage Vital Signs ED Triage Vitals  Enc Vitals Group     BP 07/04/16 0927 137/87     Pulse Rate 07/04/16 0927 61     Resp 07/04/16 0927 18     Temp 07/04/16 0927 98 F (36.7 C)     Temp Source 07/04/16 0927 Oral     SpO2 07/04/16 0927 100 %     Weight 07/04/16 0922 139 lb (63 kg)     Height 07/04/16 0922 5\' 4"  (1.626 m)     Head Circumference --      Peak Flow --      Pain Score 07/04/16 0925 10     Pain Loc --      Pain Edu? --      Excl. in GC? --    No data found.   Updated Vital Signs BP 137/87 (BP Location: Right Arm)   Pulse 61   Temp 98 F (36.7 C) (Oral)   Resp 18   Ht 5\' 4"  (1.626 m)   Wt 139 lb  (63 kg)   LMP 04/03/2016   SpO2 100%   BMI 23.86 kg/m   Visual Acuity Right Eye Distance:   Left Eye Distance:   Bilateral Distance:    Right Eye Near:   Left Eye Near:    Bilateral Near:     Physical Exam  Constitutional: She is oriented to person, place, and time. She appears well-developed and well-nourished. No distress.  HENT:  Head: Normocephalic and atraumatic.  Right Ear: Tympanic membrane, external ear and ear canal normal.  Left Ear: Tympanic membrane, external ear and ear canal normal.  Nose: Mucosal edema and rhinorrhea present. No nose lacerations, sinus tenderness, nasal deformity, septal deviation or nasal septal hematoma. No epistaxis.  No foreign bodies. Right sinus exhibits maxillary sinus tenderness and frontal sinus tenderness. Left sinus exhibits maxillary sinus tenderness and frontal sinus tenderness.  Mouth/Throat: Uvula is midline, oropharynx is clear and moist and mucous membranes are normal. No oropharyngeal exudate.  Eyes: Conjunctivae and EOM are normal. Pupils are equal, round, and reactive to light. Right eye exhibits no discharge. Left eye exhibits no discharge. No scleral icterus.  Neck: Normal range of motion. Neck supple. No thyromegaly present.  Cardiovascular: Normal rate, regular rhythm and normal heart sounds.   Pulmonary/Chest: Effort normal and breath sounds normal. No respiratory distress. She has no wheezes. She has no rales.  Lymphadenopathy:    She has no cervical adenopathy.  Neurological: She is alert and oriented to person, place, and time. She displays normal reflexes. No cranial nerve deficit. She exhibits normal muscle tone. Coordination normal.  Skin: She is not diaphoretic.  Nursing note and vitals reviewed.    UC Treatments / Results  Labs (all labs ordered are listed, but only abnormal results are displayed) Labs Reviewed - No data to display  EKG  EKG Interpretation None       Radiology No results  found.  Procedures Procedures (including critical care time)  Medications Ordered in UC Medications  ketorolac (TORADOL) injection 60 mg (60 mg Intramuscular Given 07/04/16 0939)     Initial Impression / Assessment and Plan / UC Course  I have reviewed the triage vital signs and the nursing notes.  Pertinent labs & imaging results that were available during my care of the patient were reviewed by me and considered in my medical decision making (see  chart for details).  Clinical Course      Final Clinical Impressions(s) / UC Diagnoses   Final diagnoses:  Acute maxillary sinusitis, recurrence not specified  Nonintractable migraine, unspecified migraine type    New Prescriptions New Prescriptions   AZITHROMYCIN (ZITHROMAX Z-PAK) 250 MG TABLET    2 tabs po once day 1, then 1 tab po qd for next 4 days   FLUCONAZOLE (DIFLUCAN) 150 MG TABLET    Take 1 tablet (150 mg total) by mouth daily.    1.  diagnosis reviewed with patient/parent 2. rx as per orders above; reviewed possible side effects, interactions, risks and benefits  3. Recommend supportive treatment with rest, otc flonase 4. Patient given toradol 60mg  im x 1 with improvement of symptoms 5. Follow-up prn if symptoms worsen or don't improve   Payton Mccallum, MD 07/04/16 [phone]

## 2016-07-04 NOTE — ED Triage Notes (Addendum)
Patient complains of earache, migraine, congestion that started about 3-4 days ago. She has tried OTC migraine medicine and tylenol sinus congestion, with some relief but it always comes back when it wears off. Headache sensitive to light and has had some nausea.

## 2016-09-19 ENCOUNTER — Telehealth: Payer: Self-pay | Admitting: *Deleted

## 2016-09-19 ENCOUNTER — Ambulatory Visit
Admission: EM | Admit: 2016-09-19 | Discharge: 2016-09-19 | Disposition: A | Discharge status: Home / Self Care | Payer: Managed Care, Other (non HMO) | Attending: Family Medicine | Admitting: Family Medicine

## 2016-09-19 DIAGNOSIS — J0100 Acute maxillary sinusitis, unspecified: Secondary | ICD-10-CM | POA: Diagnosis not present

## 2016-09-19 MED ORDER — AZITHROMYCIN 250 MG PO TABS: ORAL_TABLET | ORAL | 0 refills | Status: DC

## 2016-09-19 MED ORDER — FLUCONAZOLE 150 MG PO TABS: 150.0000 mg | ORAL_TABLET | Freq: Every day | ORAL | 0 refills | Status: DC

## 2016-09-19 NOTE — ED Triage Notes (Signed)
Patient complains of sinus pain and pressure with migraines, ear pain. Patient states that she has tried OTC ear drops without relief. Patient states that the headache has been terrible.

## 2016-09-19 NOTE — ED Provider Notes (Signed)
[facility]    CSN: [id number] Arrival date & time: 09/19/16  0908     History   Chief Complaint Chief Complaint  Patient presents with  . Sinus Problem    HPI Alyssa Valencia is a 45 y.o. female.   The history is provided by the patient.  Sinus Problem  Associated symptoms include headaches.  URI  Presenting symptoms: congestion, facial pain, fatigue and fever   Severity:  Moderate Onset quality:  Sudden Duration:  1 week Timing:  Constant Progression:  Worsening Chronicity:  New Relieved by:  Nothing Ineffective treatments:  OTC medications Associated symptoms: headaches and sinus pain   Associated symptoms: no wheezing   Risk factors: not elderly, no chronic cardiac disease, no chronic kidney disease, no chronic respiratory disease, no diabetes mellitus, no immunosuppression, no recent illness, no recent travel and no sick contacts     Past Medical History:  Diagnosis Date  . Patient denies medical problems   . UTI (lower urinary tract infection)     There are no active problems to display for this patient.   Past Surgical History:  Procedure Laterality Date  . NO PAST SURGERIES      OB History    Gravida Para Term Preterm AB Living   1 0     1     SAB TAB Ectopic Multiple Live Births                   Home Medications    Prior to Admission medications   Medication Sig Start Date End Date Taking? Authorizing Provider  acetaminophen (TYLENOL) 325 MG tablet Take 650 mg by mouth every 6 (six) hours as needed.   Yes Historical Provider, MD  famotidine (PEPCID) 20 MG tablet Take 1 tablet (20 mg total) by mouth 2 (two) times daily. 06/19/15  Yes Domenick GongAshley Mortenson, MD  levonorgestrel-ethinyl estradiol (SEASONALE,INTROVALE,JOLESSA) 0.15-0.03 MG tablet Take 1 tablet by mouth daily.   Yes Historical Provider, MD  loratadine (ALLERGY) 10 MG tablet Take 10 mg by mouth daily.   Yes Historical Provider, MD  acetaminophen (TYLENOL) 500 MG tablet Take 1  tablet (500 mg total) by mouth every 6 (six) hours as needed. 09/19/15   Barbaraann Barthelina A Betancourt, NP  azithromycin (ZITHROMAX Z-PAK) 250 MG tablet 2 tabs po once day 1, then 1 tab po qd for next 4 days 09/19/16   Payton Mccallumrlando Derian Pfost, MD  fluconazole (DIFLUCAN) 150 MG tablet Take 1 tablet (150 mg total) by mouth daily. 07/04/16   Payton Mccallumrlando Meegan Shanafelt, MD  fluticasone (FLONASE) 50 MCG/ACT nasal spray Place 1 spray into both nostrils 2 (two) times daily. 09/19/15 10/03/15  Barbaraann Barthelina A Betancourt, NP    Family History Family History  Problem Relation Age of Onset  . Irritable bowel syndrome Mother   . Nephrolithiasis Mother   . Heart attack Father     Social History Social History  Substance Use Topics  . Smoking status: Former Smoker    Quit date: 11/04/1985  . Smokeless tobacco: Never Used  . Alcohol use Yes     Comment: occasionally     Allergies   Penicillins and Shellfish allergy   Review of Systems Review of Systems  Constitutional: Positive for fatigue and fever.  HENT: Positive for congestion and sinus pain.   Respiratory: Negative for wheezing.   Neurological: Positive for headaches.     Physical Exam Triage Vital Signs ED Triage Vitals  Enc Vitals Group     BP 09/19/16 0952 Marland Kitchen(!)  141/90     Pulse Rate 09/19/16 0952 (!) 58     Resp 09/19/16 0952 16     Temp 09/19/16 0952 98.7 F (37.1 C)     Temp Source 09/19/16 0952 Oral     SpO2 09/19/16 0952 100 %     Weight 09/19/16 0950 142 lb (64.4 kg)     Height 09/19/16 0950 5\' 4"  (1.626 m)     Head Circumference --      Peak Flow --      Pain Score 09/19/16 0951 8     Pain Loc --      Pain Edu? --      Excl. in GC? --    No data found.   Updated Vital Signs BP (!) 141/90 (BP Location: Left Arm)   Pulse (!) 58   Temp 98.7 F (37.1 C) (Oral)   Resp 16   Ht 5\' 4"  (1.626 m)   Wt 142 lb (64.4 kg)   SpO2 100%   BMI 24.37 kg/m   Visual Acuity Right Eye Distance:   Left Eye Distance:   Bilateral Distance:    Right Eye Near:     Left Eye Near:    Bilateral Near:     Physical Exam  Constitutional: She appears well-developed and well-nourished. No distress.  HENT:  Head: Normocephalic and atraumatic.  Right Ear: Tympanic membrane, external ear and ear canal normal.  Left Ear: Tympanic membrane, external ear and ear canal normal.  Nose: Mucosal edema and rhinorrhea present. No nose lacerations, sinus tenderness, nasal deformity, septal deviation or nasal septal hematoma. No epistaxis.  No foreign bodies. Right sinus exhibits maxillary sinus tenderness and frontal sinus tenderness. Left sinus exhibits maxillary sinus tenderness and frontal sinus tenderness.  Mouth/Throat: Uvula is midline, oropharynx is clear and moist and mucous membranes are normal. No oropharyngeal exudate.  Eyes: Conjunctivae and EOM are normal. Pupils are equal, round, and reactive to light. Right eye exhibits no discharge. Left eye exhibits no discharge. No scleral icterus.  Neck: Normal range of motion. Neck supple. No thyromegaly present.  Cardiovascular: Normal rate, regular rhythm and normal heart sounds.   Pulmonary/Chest: Effort normal and breath sounds normal. No respiratory distress. She has no wheezes. She has no rales.  Lymphadenopathy:    She has no cervical adenopathy.  Skin: She is not diaphoretic.  Nursing note and vitals reviewed.    UC Treatments / Results  Labs (all labs ordered are listed, but only abnormal results are displayed) Labs Reviewed - No data to display  EKG  EKG Interpretation None       Radiology No results found.  Procedures Procedures (including critical care time)  Medications Ordered in UC Medications - No data to display   Initial Impression / Assessment and Plan / UC Course  I have reviewed the triage vital signs and the nursing notes.  Pertinent labs & imaging results that were available during my care of the patient were reviewed by me and considered in my medical decision making (see  chart for details).  Clinical Course       Final Clinical Impressions(s) / UC Diagnoses   Final diagnoses:  Acute maxillary sinusitis, recurrence not specified    New Prescriptions Discharge Medication List as of 09/19/2016 10:15 AM     1. diagnosis reviewed with patient 2. rx as per orders above; reviewed possible side effects, interactions, risks and benefits  3. Recommend supportive treatment with otc flonase 4. Follow-up prn if symptoms  worsen or don't improve   Payton Mccallum, MD 09/19/16 1030

## 2016-10-17 DIAGNOSIS — N898 Other specified noninflammatory disorders of vagina: Secondary | ICD-10-CM | POA: Diagnosis not present

## 2016-11-21 DIAGNOSIS — N3000 Acute cystitis without hematuria: Secondary | ICD-10-CM | POA: Diagnosis not present

## 2016-12-07 ENCOUNTER — Ambulatory Visit
Admission: EM | Admit: 2016-12-07 | Discharge: 2016-12-07 | Disposition: A | Discharge status: Home / Self Care | Payer: Managed Care, Other (non HMO) | Attending: Internal Medicine | Admitting: Internal Medicine

## 2016-12-07 DIAGNOSIS — S39012A Strain of muscle, fascia and tendon of lower back, initial encounter: Secondary | ICD-10-CM | POA: Diagnosis not present

## 2016-12-07 LAB — URINALYSIS, COMPLETE (UACMP) WITH MICROSCOPIC
BILIRUBIN URINE: NEGATIVE
GLUCOSE, UA: 100 mg/dL — AB
KETONES UR: NEGATIVE mg/dL
Nitrite: NEGATIVE
PH: 5.5 (ref 5.0–8.0)
Protein, ur: NEGATIVE mg/dL
Specific Gravity, Urine: 1.015 (ref 1.005–1.030)

## 2016-12-07 MED ORDER — NAPROXEN 500 MG PO TABS: 500.0000 mg | ORAL_TABLET | Freq: Two times a day (BID) | ORAL | 0 refills | Status: DC

## 2016-12-07 MED ORDER — METAXALONE 800 MG PO TABS: 800.0000 mg | ORAL_TABLET | Freq: Three times a day (TID) | ORAL | 0 refills | Status: DC

## 2016-12-07 NOTE — ED Provider Notes (Signed)
CSN: [id number]     Arrival date & time 12/07/16  1041 History   First MD Initiated Contact with Patient 12/07/16 1213     Chief Complaint  Patient presents with  . Back Pain  . Urinary Tract Infection   (Consider location/radiation/quality/duration/timing/severity/associated sxs/prior Treatment) HPI  So 46 year old female who presents with right-sided Whalin back pain that is exacerbated with any movement. This started a few days prior to this visit. She states that it does not affect the left side but only the right. There is no radiation of the pain. 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 She has no incontinence. She has no vaginal discharge. She tells me that she is not having any burning urgency or dysuria. She has recently been treated by [facility] doctor for a UTI and states that the symptoms resolve for a few days only to seem to return in resolved. Had no fever or chills. He has a history of microscopic hematuria. She is currently not on her period.         Past Medical History:  Diagnosis Date  . Patient denies medical problems   . UTI (lower urinary tract infection)    Past Surgical History:  Procedure Laterality Date  . NO PAST SURGERIES     Family History  Problem Relation Age of Onset  . Irritable bowel syndrome Mother   . Nephrolithiasis Mother   . Heart attack Father    Social History  Substance Use Topics  . Smoking status: Former Smoker    Quit date: 11/04/1985  . Smokeless tobacco: Never Used  . Alcohol use Yes     Comment: occasionally   OB History    Gravida Para Term Preterm AB Living   1 0     1     SAB TAB Ectopic Multiple Live Births                 Review of Systems  Constitutional: Positive for activity change. Negative for appetite change, chills, fatigue and fever.  Genitourinary: Negative for difficulty urinating, dysuria, flank pain, frequency, hematuria, urgency,  vaginal bleeding and vaginal discharge.  All other systems reviewed and are negative.   Allergies  Penicillins and Shellfish allergy  Home Medications   Prior to Admission medications   Medication Sig Start Date End Date Taking? Authorizing Provider  acetaminophen (TYLENOL) 325 MG tablet Take 650 mg by mouth every 6 (six) hours as needed.   Yes Historical Provider, MD  famotidine (PEPCID) 20 MG tablet Take 1 tablet (20 mg total) by mouth 2 (two) times daily. 06/19/15  Yes Domenick Gong, MD  levonorgestrel-ethinyl estradiol (SEASONALE,INTROVALE,JOLESSA) 0.15-0.03 MG tablet Take 1 tablet by mouth daily.   Yes Historical Provider, MD  loratadine (ALLERGY) 10 MG tablet Take 10 mg by mouth daily.   Yes Historical Provider, MD  metaxalone (SKELAXIN) 800 MG tablet Take 1 tablet (800 mg total) by mouth 3 (three) times daily. As necessary for muscle spasm 12/07/16   Lutricia Feil, PA-C  naproxen (NAPROSYN) 500 MG tablet Take 1 tablet (500 mg total) by mouth 2 (two) times daily with a meal. 12/07/16   Lutricia Feil, PA-C   Meds Ordered and Administered this Visit  Medications - No data to display  BP 125/86 (BP Location: Left Arm)   Pulse 74   Temp 98 F (36.7 C) (Oral)   Resp 20   Ht 5'  4" (1.626 m)   Wt 145 lb (65.8 kg)   LMP 12/06/2016   SpO2 97%   BMI 24.89 kg/m  No data found.   Physical Exam  Constitutional: She is oriented to person, place, and time. She appears well-developed and well-nourished. No distress.  HENT:  Head: Normocephalic and atraumatic.  Eyes: EOM are normal. Pupils are equal, round, and reactive to light. Right eye exhibits no discharge. Left eye exhibits no discharge.  Neck: Normal range of motion. Neck supple.  Musculoskeletal: She exhibits tenderness.  Examination of the spine is performed with Jacki Cones, RN as Nurse, children's. She has a level pelvis in stance. He has tenderness in the paraspinous muscles in the right from approximately L3-S1. To  forward flex with her hands to level of her knee and straightening to erect posture was comfortable. Lateral flexion was full with some blunting of the lumbar spine to the right. Thoracic rotation to the left reproduced her right-sided pain. Is able to toe and heel walk normally. DTRs were 2+ over 4 at the knee and the ankle. There was no clonus present. EHL peroneal and posterior tibialis were strong to clinical testing. Sensation was intact throughout on the lower extremities. Straight leg raise testing on the right was positive at 90 with back pain and  pain over anterior thigh.  Neurological: She is alert and oriented to person, place, and time. She displays normal reflexes. No sensory deficit. She exhibits normal muscle tone. Coordination normal.  Skin: Skin is warm and dry. She is not diaphoretic.  Psychiatric: She has a normal mood and affect. Her behavior is normal. Judgment and thought content normal.  Nursing note and vitals reviewed.   Urgent Care Course     Procedures (including critical care time)  Labs Review Labs Reviewed  URINALYSIS, COMPLETE (UACMP) WITH MICROSCOPIC - Abnormal; Notable for the following:       Result Value   APPearance CLOUDY (*)    Glucose, UA 100 (*)    Hgb urine dipstick MODERATE (*)    Leukocytes, UA SMALL (*)    Squamous Epithelial / LPF 0-5 (*)    Bacteria, UA MANY (*)    All other components within normal limits  URINE CULTURE    Imaging Review No results found.   Visual Acuity Review  Right Eye Distance:   Left Eye Distance:   Bilateral Distance:    Right Eye Near:   Left Eye Near:    Bilateral Near:         MDM   1. Strain of lumbar region, initial encounter    Discharge Medication List as of 12/07/2016 12:54 PM    START taking these medications   Details  metaxalone (SKELAXIN) 800 MG tablet Take 1 tablet (800 mg total) by mouth 3 (three) times daily. As necessary for muscle spasm, Starting Fri 12/07/2016, Normal     naproxen (NAPROSYN) 500 MG tablet Take 1 tablet (500 mg total) by mouth 2 (two) times daily with a meal., Starting Fri 12/07/2016, Normal      Plan: 1. Test/x-ray results and diagnosis reviewed with patient 2. rx as per orders; risks, benefits, potential side effects reviewed with patient 3. Recommend supportive treatment with rest and symptom avoidance. She may use heat alternating with ice to help her with her pain. Cautioned her regarding use of muscle relaxants when performing activities requiring concentration and judgment. She should not drive while taking the muscle relaxants. I recommended that she follow-up with her primary  care next week if she is not improving. Because of her vague urinary symptoms urine analysis will obtain a culture to rule out continuing or incomplete treatment of her UTI. 4. F/u prn if symptoms worsen or don't improve     Lutricia FeilWilliam P Velvie Thomaston, PA-C 12/07/16 1301

## 2016-12-07 NOTE — ED Triage Notes (Signed)
Pt c/o having a UTI a couple weeks ago and was treated. However a few days ago she started to have some of the same symptoms  (burning, frequency) but also starting experience some lower right back pain, She doesn't think its a pulled muscle because when she voids it hurts. She says she has a lot of pressure in her lower abdomen also.

## 2016-12-09 LAB — URINE CULTURE

## 2016-12-11 ENCOUNTER — Telehealth: Payer: Self-pay

## 2017-03-28 ENCOUNTER — Ambulatory Visit
Admission: EM | Admit: 2017-03-28 | Discharge: 2017-03-28 | Disposition: A | Discharge status: Home / Self Care | Payer: 59 | Attending: Internal Medicine | Admitting: Internal Medicine

## 2017-03-28 ENCOUNTER — Encounter: Payer: Self-pay | Admitting: *Deleted

## 2017-03-28 DIAGNOSIS — R300 Dysuria: Secondary | ICD-10-CM

## 2017-03-28 DIAGNOSIS — H60332 Swimmer's ear, left ear: Secondary | ICD-10-CM

## 2017-03-28 DIAGNOSIS — J0110 Acute frontal sinusitis, unspecified: Secondary | ICD-10-CM

## 2017-03-28 DIAGNOSIS — R350 Frequency of micturition: Secondary | ICD-10-CM

## 2017-03-28 LAB — URINALYSIS, COMPLETE (UACMP) WITH MICROSCOPIC
BILIRUBIN URINE: NEGATIVE
Glucose, UA: NEGATIVE mg/dL
Ketones, ur: NEGATIVE mg/dL
LEUKOCYTES UA: NEGATIVE
NITRITE: NEGATIVE
PROTEIN: NEGATIVE mg/dL
Specific Gravity, Urine: 1.015 (ref 1.005–1.030)
pH: 5 (ref 5.0–8.0)

## 2017-03-28 MED ORDER — TOBRAMYCIN-DEXAMETHASONE 0.3-0.1 % OP SUSP: 1.0000 [drp] | OPHTHALMIC | 0 refills | Status: DC

## 2017-03-28 MED ORDER — FLUCONAZOLE 150 MG PO TABS: 150.0000 mg | ORAL_TABLET | Freq: Every day | ORAL | 0 refills | Status: DC

## 2017-03-28 MED ORDER — AMOXICILLIN 875 MG PO TABS: 875.0000 mg | ORAL_TABLET | Freq: Two times a day (BID) | ORAL | 0 refills | Status: AC

## 2017-03-28 NOTE — ED Triage Notes (Signed)
Dysuria, urinary freq/urg, mild nausea, intermittent fever, x1 week.  Pt removed 2 ticks from right thigh Sunday. Also, cough, head congestion, and bilat ear and facial pain x3 days.

## 2017-03-28 NOTE — ED Provider Notes (Signed)
[facility]    CSN: [id number] Arrival date & time: 03/28/17  0940     History   Chief Complaint Chief Complaint  Patient presents with  . Dysuria  . Urinary Frequency  . Insect Bite  . Otalgia  . Facial Pain    HPI Alyssa Valencia is a 46 y.o. female.   Pt complains of sinus congestion and worsening allergies for weeks.  She admits to fever, headaches and muscle tension in her neck/shoulders intermittently during that time.  C/o ear pressure as well, left worse than right.  Incidentally, bitten by 2 ticks several days ago. Ticks were attached for only an afternoon.      Past Medical History:  Diagnosis Date  . Patient denies medical problems   . UTI (lower urinary tract infection)     There are no active problems to display for this patient.   Past Surgical History:  Procedure Laterality Date  . NO PAST SURGERIES      OB History    Gravida Para Term Preterm AB Living   1 0     1     SAB TAB Ectopic Multiple Live Births                   Home Medications    Prior to Admission medications   Medication Sig Start Date End Date Taking? Authorizing Provider  acetaminophen (TYLENOL) 325 MG tablet Take 650 mg by mouth every 6 (six) hours as needed.   Yes [provider]  famotidine (PEPCID) 20 MG tablet Take 1 tablet (20 mg total) by mouth 2 (two) times daily. 06/19/15  Yes Domenick Gong, MD  levocetirizine (XYZAL) 5 MG tablet Take 5 mg by mouth every evening.   Yes [provider]  levonorgestrel-ethinyl estradiol (SEASONALE,INTROVALE,JOLESSA) 0.15-0.03 MG tablet Take 1 tablet by mouth daily.   Yes [provider]  amoxicillin (AMOXIL) 875 MG tablet Take 1 tablet (875 mg total) by mouth 2 (two) times daily. 03/28/17 04/07/17  Arnaldo Natal, MD  fluconazole (DIFLUCAN) 150 MG tablet Take 1 tablet (150 mg total) by mouth daily. 03/28/17   Arnaldo Natal, MD  loratadine (ALLERGY) 10 MG tablet Take 10 mg by mouth daily.     [provider]  metaxalone (SKELAXIN) 800 MG tablet Take 1 tablet (800 mg total) by mouth 3 (three) times daily. As necessary for muscle spasm 12/07/16   Ovid Curd P, PA-C  naproxen (NAPROSYN) 500 MG tablet Take 1 tablet (500 mg total) by mouth 2 (two) times daily with a meal. 12/07/16   Lutricia Feil, PA-C  tobramycin-dexamethasone [facility]) ophthalmic solution Place 1 drop into the right ear every 4 (four) hours while awake. 03/28/17   Arnaldo Natal, MD    Family History Family History  Problem Relation Age of Onset  . Irritable bowel syndrome Mother   . Nephrolithiasis Mother   . Heart attack Father     Social History Social History  Substance Use Topics  . Smoking status: Former Smoker    Quit date: 11/04/1985  . Smokeless tobacco: Never Used  . Alcohol use Yes     Comment: occasionally     Allergies   Penicillins and Shellfish allergy   Review of Systems Review of Systems  Constitutional: Negative for chills and fever.  HENT: Positive for congestion, ear pain and sinus pressure. Negative for rhinorrhea, sore throat and tinnitus.   Eyes: Negative for redness.  Respiratory: Negative for cough and shortness  of breath.   Cardiovascular: Negative for chest pain and palpitations.  Gastrointestinal: Negative for abdominal pain, diarrhea, nausea and vomiting.  Genitourinary: Negative for dysuria, frequency and urgency.  Musculoskeletal: Negative for myalgias.  Skin: Negative for rash.       No lesions  Neurological: Positive for headaches. Negative for weakness.  Hematological: Does not bruise/bleed easily.  Psychiatric/Behavioral: Negative for suicidal ideas.     Physical Exam Triage Vital Signs ED Triage Vitals  Enc Vitals Group     BP 03/28/17 1003 140/89     Pulse Rate 03/28/17 1003 60     Resp 03/28/17 1003 16     Temp 03/28/17 1003 98.1 F (36.7 C)     Temp Source 03/28/17 1003 Oral     SpO2 03/28/17 1003 100 %     Weight 03/28/17 1004  149 lb (67.6 kg)     Height 03/28/17 1004 5\' 4"  (1.626 m)     Head Circumference --      Peak Flow --      Pain Score --      Pain Loc --      Pain Edu? --      Excl. in GC? --    No data found.   Updated Vital Signs BP 140/89 (BP Location: Left Arm)   Pulse 60   Temp 98.1 F (36.7 C) (Oral)   Resp 16   Ht 5\' 4"  (1.626 m)   Wt 149 lb (67.6 kg)   SpO2 100%   BMI 25.58 kg/m   Visual Acuity Right Eye Distance:   Left Eye Distance:   Bilateral Distance:    Right Eye Near:   Left Eye Near:    Bilateral Near:     Physical Exam  Constitutional: She is oriented to person, place, and time. She appears well-developed and well-nourished. No distress.  HENT:  Head: Normocephalic and atraumatic.  Right Ear: Tympanic membrane is not retracted and not bulging. A middle ear effusion is present.  Left Ear: Tympanic membrane is injected. Tympanic membrane is not retracted and not bulging. A middle ear effusion is present.  Mouth/Throat: Oropharynx is clear and moist.  Left external ear canal inflammed  Eyes: Conjunctivae and EOM are normal. Pupils are equal, round, and reactive to light. No scleral icterus.  Neck: Normal range of motion. Neck supple. No JVD present. Muscular tenderness present. No neck rigidity. No tracheal deviation and normal range of motion present. No thyromegaly present.  Cardiovascular: Normal rate, regular rhythm and normal heart sounds.  Exam reveals no gallop and no friction rub.   No murmur heard. Pulmonary/Chest: Effort normal and breath sounds normal.  Abdominal: Soft. Bowel sounds are normal. She exhibits no distension. There is no tenderness.  Musculoskeletal: Normal range of motion. She exhibits no edema.  Lymphadenopathy:    She has no cervical adenopathy.  Neurological: She is alert and oriented to person, place, and time. No cranial nerve deficit.  Skin: Skin is warm and dry.  Psychiatric: She has a normal mood and affect. Her behavior is normal.  Judgment and thought content normal.  Nursing note and vitals reviewed.    UC Treatments / Results  Labs (all labs ordered are listed, but only abnormal results are displayed) Labs Reviewed  URINALYSIS, COMPLETE (UACMP) WITH MICROSCOPIC - Abnormal; Notable for the following:       Result Value   Color, Urine STRAW (*)    Hgb urine dipstick MODERATE (*)    Squamous Epithelial /  LPF 0-5 (*)    Bacteria, UA MANY (*)    All other components within normal limits    EKG  EKG Interpretation None       Radiology No results found.  Procedures Procedures (including critical care time)  Medications Ordered in UC Medications - No data to display   Initial Impression / Assessment and Plan / UC Course  I have reviewed the triage vital signs and the nursing notes.  Pertinent labs & imaging results that were available during my care of the patient were reviewed by me and considered in my medical decision making (see chart for details).     Seasonal allergies contributing to congestion that is now c/w sinus infection.  Associated serous mid ear effusions. Swimmer's ear on the left as well.  Tick bite is coincidental. Timing of symptoms not c/w tick-bourne illness.  Augmentin not prescribed for sinusitis due to patient intolearance.  PCN "allergy" not severe.   Final Clinical Impressions(s) / UC Diagnoses   Final diagnoses:  Acute swimmer's ear of left side  Subacute frontal sinusitis    New Prescriptions New Prescriptions   AMOXICILLIN (AMOXIL) 875 MG TABLET    Take 1 tablet (875 mg total) by mouth 2 (two) times daily.   FLUCONAZOLE (DIFLUCAN) 150 MG TABLET    Take 1 tablet (150 mg total) by mouth daily.   TOBRAMYCIN-DEXAMETHASONE (TOBRADEX) OPHTHALMIC SOLUTION    Place 1 drop into the right ear every 4 (four) hours while awake.     Arnaldo Natal, MD 03/28/17 [phone]

## 2017-04-08 DIAGNOSIS — Z3041 Encounter for surveillance of contraceptive pills: Secondary | ICD-10-CM | POA: Diagnosis not present

## 2017-04-08 DIAGNOSIS — Z0000 Encounter for general adult medical examination without abnormal findings: Secondary | ICD-10-CM | POA: Diagnosis not present

## 2017-04-18 DIAGNOSIS — R509 Fever, unspecified: Secondary | ICD-10-CM | POA: Diagnosis not present

## 2017-04-18 DIAGNOSIS — J029 Acute pharyngitis, unspecified: Secondary | ICD-10-CM | POA: Diagnosis not present

## 2017-05-06 DIAGNOSIS — L918 Other hypertrophic disorders of the skin: Secondary | ICD-10-CM | POA: Diagnosis not present

## 2017-07-03 ENCOUNTER — Encounter: Payer: Self-pay | Admitting: Emergency Medicine

## 2017-07-03 ENCOUNTER — Ambulatory Visit
Admission: EM | Admit: 2017-07-03 | Discharge: 2017-07-03 | Disposition: A | Discharge status: Home / Self Care | Payer: 59 | Attending: Emergency Medicine | Admitting: Emergency Medicine

## 2017-07-03 DIAGNOSIS — H6983 Other specified disorders of Eustachian tube, bilateral: Secondary | ICD-10-CM | POA: Diagnosis not present

## 2017-07-03 DIAGNOSIS — J0140 Acute pansinusitis, unspecified: Secondary | ICD-10-CM | POA: Diagnosis not present

## 2017-07-03 DIAGNOSIS — J301 Allergic rhinitis due to pollen: Secondary | ICD-10-CM

## 2017-07-03 MED ORDER — FLUCONAZOLE 150 MG PO TABS: 150.0000 mg | ORAL_TABLET | Freq: Once | ORAL | 1 refills | Status: AC

## 2017-07-03 MED ORDER — FLUTICASONE PROPIONATE 50 MCG/ACT NA SUSP: 2.0000 | Freq: Every day | NASAL | 0 refills | Status: DC

## 2017-07-03 MED ORDER — AMOXICILLIN-POT CLAVULANATE 875-125 MG PO TABS: 1.0000 | ORAL_TABLET | Freq: Two times a day (BID) | ORAL | 0 refills | Status: DC

## 2017-07-03 MED ORDER — IBUPROFEN 600 MG PO TABS: 600.0000 mg | ORAL_TABLET | Freq: Four times a day (QID) | ORAL | 0 refills | Status: DC | PRN

## 2017-07-03 NOTE — ED Provider Notes (Signed)
HPI  SUBJECTIVE:  Alyssa Valencia is a 46 y.o. female who presents with nasal congestion, postnasal drip, ST,  bilateral sinus pain/pressure worse with lying down x 10 days, ear pain/fullness. Reports frontal headache, states her upper teeth hurt. Denies otorrhea, reports intermittently muffled hearing, when her ears pop this improves her hearing. she reports allergy type symptoms which are getting better with Zyrtec. Also tried Xyzal. No fevers. PMH seasonal allergies, No h/o DM, HTN. LMP: June. She is on three-month OCPs denies the possibility of being pregnant. PMD: Dr. Barry Dienes.   Past Medical History:  Diagnosis Date  . Patient denies medical problems   . UTI (lower urinary tract infection)     Past Surgical History:  Procedure Laterality Date  . NO PAST SURGERIES      Family History  Problem Relation Age of Onset  . Irritable bowel syndrome Mother   . Nephrolithiasis Mother   . Heart attack Father     Social History  Substance Use Topics  . Smoking status: Former Smoker    Quit date: 11/04/1985  . Smokeless tobacco: Never Used  . Alcohol use Yes     Comment: occasionally    No current facility-administered medications for this encounter.   Current Outpatient Prescriptions:  .  amoxicillin-clavulanate (AUGMENTIN) 875-125 MG tablet, Take 1 tablet by mouth 2 (two) times daily. X 7 days, Disp: 14 tablet, Rfl: 0 .  famotidine (PEPCID) 20 MG tablet, Take 1 tablet (20 mg total) by mouth 2 (two) times daily., Disp: 10 tablet, Rfl: 0 .  fluticasone (FLONASE) 50 MCG/ACT nasal spray, Place 2 sprays into both nostrils daily., Disp: 16 g, Rfl: 0 .  ibuprofen (ADVIL,MOTRIN) 600 MG tablet, Take 1 tablet (600 mg total) by mouth every 6 (six) hours as needed., Disp: 30 tablet, Rfl: 0 .  levonorgestrel-ethinyl estradiol (SEASONALE,INTROVALE,JOLESSA) 0.15-0.03 MG tablet, Take 1 tablet by mouth daily., Disp: , Rfl:  .  tobramycin-dexamethasone (TOBRADEX) ophthalmic solution, Place 1 drop into the  right ear every 4 (four) hours while awake., Disp: 5 mL, Rfl: 0  Allergies  Allergen Reactions  . Penicillins     "Drowsy"  . Shellfish Allergy Rash     ROS  As noted in HPI.   Physical Exam  BP (!) 145/94 (BP Location: Left Arm)   Pulse 70   Temp 98.4 F (36.9 C) (Oral)   Resp 16   Ht 5\' 4"  (1.626 m)   Wt 145 lb (65.8 kg)   LMP 04/16/2017 (Approximate)   SpO2 99%   BMI 24.89 kg/m   Constitutional: Well developed, well nourished, no acute distress Eyes:  EOMI, conjunctiva normal bilaterally HENT: Normocephalic, atraumatic,mucus membranes moist. TMs normal bilaterally. + purulent nasal congestion. Swollen red turbinates. + maxillary sinus tenderness, +  frontal sinus tenderness. Oropharynx normal - postnasal drip. + Cobblestoning Respiratory: Normal inspiratory effort Cardiovascular: Normal rate GI: nondistended skin: No rash, skin intact Musculoskeletal: no deformities Neurologic: Alert & oriented x 3, no focal neuro deficits Psychiatric: Speech and behavior appropriate   ED Course   Medications - No data to display  No orders of the defined types were placed in this encounter.   No results found for this or any previous visit (from the past 24 hour(s)). No results found.  ED Clinical Impression  Acute non-recurrent pansinusitis  Eustachian tube dysfunction, bilateral  Seasonal allergic rhinitis due to pollen  ED Assessment/Plan   Pt with indications for abx. Will start augmentin, in addition to nasal steriods, Zyrtec-D, saline nasal  irrigation, increase fluids, tylenol/motrin prn pain. Discussed MDM and plan with pt. Discussed sn/sx that should prompt return to the  ED. Pt agrees with plan and will f/u with PMD prn.n  *This clinic note was created using Dragon dictation software. Therefore, there may be occasional mistakes despite careful proofreading.  ?    Domenick GongMortenson, [name], MD 07/03/17 [phone]

## 2017-07-03 NOTE — Discharge Instructions (Signed)
Take the medication as written. Return to the ER if you get worse, have a fever >100.4, or for any concerns. You may take 600 mg of motrin with 1 gram of tylenol up to 3-4 times a day as needed for pain. This is an effective combination for pain.  Stop Zyrtec, start Zyrtec-D. Use a neti pot or the NeilMed sinus rinse as often as you want to to reduce nasal congestion. Follow the directions on the box.   Go to www.goodrx.com to look up your medications. This will give you a list of where you can find your prescriptions at the most affordable prices. Or you can ask the pharmacist what the cash price is. This is frequently cheaper than going through insurance.

## 2017-07-03 NOTE — ED Triage Notes (Signed)
Patient c/o sinus congestion and pressure that started over a week ago.  Patient denies recent fevers.

## 2017-07-17 DIAGNOSIS — N3000 Acute cystitis without hematuria: Secondary | ICD-10-CM | POA: Diagnosis not present

## 2017-07-17 DIAGNOSIS — B379 Candidiasis, unspecified: Secondary | ICD-10-CM | POA: Diagnosis not present

## 2017-07-17 DIAGNOSIS — R300 Dysuria: Secondary | ICD-10-CM | POA: Diagnosis not present

## 2017-07-17 DIAGNOSIS — T3695XA Adverse effect of unspecified systemic antibiotic, initial encounter: Secondary | ICD-10-CM | POA: Diagnosis not present

## 2017-09-02 ENCOUNTER — Ambulatory Visit
Admission: EM | Admit: 2017-09-02 | Discharge: 2017-09-02 | Disposition: A | Discharge status: Home / Self Care | Payer: 59 | Attending: Family Medicine | Admitting: Family Medicine

## 2017-09-02 ENCOUNTER — Encounter: Payer: Self-pay | Admitting: Emergency Medicine

## 2017-09-02 DIAGNOSIS — N3001 Acute cystitis with hematuria: Secondary | ICD-10-CM | POA: Diagnosis not present

## 2017-09-02 LAB — URINALYSIS, COMPLETE (UACMP) WITH MICROSCOPIC

## 2017-09-02 MED ORDER — CEPHALEXIN 500 MG PO CAPS: 500.0000 mg | ORAL_CAPSULE | Freq: Two times a day (BID) | ORAL | 0 refills | Status: DC

## 2017-09-02 MED ORDER — FLUCONAZOLE 150 MG PO TABS: ORAL_TABLET | ORAL | 0 refills | Status: DC

## 2017-09-02 NOTE — ED Provider Notes (Signed)
[facility]    CSN: [id number] Arrival date & time: 09/02/17  1811     History   Chief Complaint Chief Complaint  Patient presents with  . Dysuria    taking pyridium    HPI Alyssa Valencia is a 46 y.o. female.   HPI  This a 46 year old female who presents today with dysuria urgency frequency but no fever chills or back pain. In review of her medical records the patient has had several urinary tract infections this year alone. She does not have any vaginal discharge. Restarted taken AZO. The symptoms started on Saturday 2 days prior to this visit. States that her mother and grandmother have had similar problems with recurrent UTIs and her mother currently is taking antibiotic Prophylactically.        Past Medical History:  Diagnosis Date  . Patient denies medical problems   . UTI (lower urinary tract infection)     There are no active problems to display for this patient.   Past Surgical History:  Procedure Laterality Date  . NO PAST SURGERIES      OB History    Gravida Para Term Preterm AB Living   1 0     1     SAB TAB Ectopic Multiple Live Births                   Home Medications    Prior to Admission medications   Medication Sig Start Date End Date Taking? Authorizing Provider  famotidine (PEPCID) 20 MG tablet Take 1 tablet (20 mg total) by mouth 2 (two) times daily. 06/19/15  Yes Domenick Gong, MD  fluticasone (FLONASE) 50 MCG/ACT nasal spray Place 2 sprays into both nostrils daily. 07/03/17  Yes Domenick Gong, MD  levonorgestrel-ethinyl estradiol (SEASONALE,INTROVALE,JOLESSA) 0.15-0.03 MG tablet Take 1 tablet by mouth daily.   Yes [provider]  loratadine (CLARITIN) 10 MG tablet Take 10 mg by mouth daily.   Yes [provider]  cephALEXin (KEFLEX) 500 MG capsule Take 1 capsule (500 mg total) by mouth 2 (two) times daily. 09/02/17   Lutricia Feil, PA-C  fluconazole (DIFLUCAN) 150 MG tablet Take one tab for  symptoms of yeast infection. Repeat x 1 in 72 hours. 09/02/17   Lutricia Feil, PA-C  ibuprofen (ADVIL,MOTRIN) 600 MG tablet Take 1 tablet (600 mg total) by mouth every 6 (six) hours as needed. 07/03/17   Domenick Gong, MD    Family History Family History  Problem Relation Age of Onset  . Irritable bowel syndrome Mother   . Nephrolithiasis Mother   . Heart attack Father     Social History Social History  Substance Use Topics  . Smoking status: Former Smoker    Quit date: 11/04/1985  . Smokeless tobacco: Never Used  . Alcohol use Yes     Comment: occasionally     Allergies   Penicillins and Shellfish allergy   Review of Systems Review of Systems  Constitutional: Positive for activity change. Negative for chills, fatigue and fever.  Genitourinary: Positive for dysuria, frequency and urgency. Negative for vaginal discharge and vaginal pain.  All other systems reviewed and are negative.    Physical Exam Triage Vital Signs ED Triage Vitals  Enc Vitals Group     BP 09/02/17 1849 (!) 161/98     Pulse Rate 09/02/17 1849 76     Resp 09/02/17 1849 16     Temp 09/02/17 1849 98.1 F (36.7 C)     Temp  Source 09/02/17 1849 Oral     SpO2 09/02/17 1849 100 %     Weight 09/02/17 1845 147 lb (66.7 kg)     Height 09/02/17 1845 5\' 4"  (1.626 m)     Head Circumference --      Peak Flow --      Pain Score 09/02/17 1845 8     Pain Loc --      Pain Edu? --      Excl. in GC? --    No data found.   Updated Vital Signs BP (!) 161/98 (BP Location: Left Arm)   Pulse 76   Temp 98.1 F (36.7 C) (Oral)   Resp 16   Ht 5\' 4"  (1.626 m)   Wt 147 lb (66.7 kg)   LMP 06/05/2017 Comment: patient on 3 month oral contraception  SpO2 100%   BMI 25.23 kg/m   Visual Acuity Right Eye Distance:   Left Eye Distance:   Bilateral Distance:    Right Eye Near:   Left Eye Near:    Bilateral Near:     Physical Exam  Constitutional: She is oriented to person, place, and time. She  appears well-developed and well-nourished. No distress.  HENT:  Head: Normocephalic.  Eyes: Pupils are equal, round, and reactive to light.  Neck: Normal range of motion.  Pulmonary/Chest: Effort normal and breath sounds normal.  Abdominal: Soft. Bowel sounds are normal. She exhibits no distension. There is no tenderness. There is no rebound and no guarding.  Musculoskeletal: Normal range of motion.  Neurological: She is alert and oriented to person, place, and time.  Skin: Skin is warm and dry. She is not diaphoretic.  Psychiatric: She has a normal mood and affect. Her behavior is normal. Judgment and thought content normal.  Nursing note and vitals reviewed.    UC Treatments / Results  Labs (all labs ordered are listed, but only abnormal results are displayed) Labs Reviewed  URINALYSIS, COMPLETE (UACMP) WITH MICROSCOPIC - Abnormal; Notable for the following:       Result Value   Color, Urine ORANGE (*)    APPearance HAZY (*)    Glucose, UA   (*)    Value: TEST NOT REPORTED DUE TO COLOR INTERFERENCE OF URINE PIGMENT   Hgb urine dipstick   (*)    Value: TEST NOT REPORTED DUE TO COLOR INTERFERENCE OF URINE PIGMENT   Bilirubin Urine   (*)    Value: TEST NOT REPORTED DUE TO COLOR INTERFERENCE OF URINE PIGMENT   Ketones, ur   (*)    Value: TEST NOT REPORTED DUE TO COLOR INTERFERENCE OF URINE PIGMENT   Protein, ur   (*)    Value: TEST NOT REPORTED DUE TO COLOR INTERFERENCE OF URINE PIGMENT   Nitrite   (*)    Value: TEST NOT REPORTED DUE TO COLOR INTERFERENCE OF URINE PIGMENT   Leukocytes, UA   (*)    Value: TEST NOT REPORTED DUE TO COLOR INTERFERENCE OF URINE PIGMENT   Squamous Epithelial / LPF 6-30 (*)    Bacteria, UA RARE (*)    All other components within normal limits  URINE CULTURE    EKG  EKG Interpretation None       Radiology No results found.  Procedures Procedures (including critical care time)  Medications Ordered in UC Medications - No data to  display   Initial Impression / Assessment and Plan / UC Course  I have reviewed the triage vital signs and the nursing notes.  Pertinent labs & imaging results that were available during my care of the patient were reviewed by me and considered in my medical decision making (see chart for details).     Plan: 1. Test/x-ray results and diagnosis reviewed with patient 2. rx as per orders; risks, benefits, potential side effects reviewed with patient 3. Recommend supportive treatment with increased fluids. Start patient on Keflex twice a day and treat for 5 days. This will be pending the results of the culture. Continue taking the AZO for 2 days and then discontinue. I have given her the name of a urologist in [place] that she can follow-up for further evaluation and treatment. 4. F/u prn if symptoms worsen or don't improve   Final Clinical Impressions(s) / UC Diagnoses   Final diagnoses:  Acute cystitis with hematuria    New Prescriptions Discharge Medication List as of 09/02/2017  7:24 PM    START taking these medications   Details  cephALEXin (KEFLEX) 500 MG capsule Take 1 capsule (500 mg total) by mouth 2 (two) times daily., Starting Mon 09/02/2017, Normal    fluconazole (DIFLUCAN) 150 MG tablet Take one tab for symptoms of yeast infection. Repeat x 1 in 72 hours., Normal         Controlled Substance Prescriptions [place] Controlled Substance Registry consulted? Not Applicable   Lutricia FeilRoemer, William P, PA-C 09/02/17 1936

## 2017-09-02 NOTE — ED Triage Notes (Signed)
Patient in today c/o dysuria since Saturday (08/31/17). Patient taking pyridium.

## 2017-09-05 ENCOUNTER — Encounter: Payer: Self-pay | Admitting: Emergency Medicine

## 2017-09-05 ENCOUNTER — Telehealth: Payer: Self-pay

## 2017-09-05 LAB — URINE CULTURE: Culture: >=100000 — AB

## 2017-09-05 MED ORDER — NITROFURANTOIN MONOHYD MACRO 100 MG PO CAPS: 100.0000 mg | ORAL_CAPSULE | Freq: Two times a day (BID) | ORAL | 0 refills | Status: DC

## 2017-09-05 NOTE — Telephone Encounter (Signed)
-----   Message from Lutricia FeilWilliam P Roemer, PA-C sent at 09/05/2017 11:30 AM EDT ----- Please notify patient of the results of her urine culture. She should stop the present medication. Instead we'll start her on Macrobid 100 mg twice a day for 5 days. Please call this into her pharmacy of record. She has any problems she should follow-up with her primary care provider. Please remind her that I recommended, because of her recurrent UTIs, that she should follow-up with a urologist. I did provide her with a name and phone number.  Thanks  [employer]

## 2017-10-03 ENCOUNTER — Ambulatory Visit
Admission: EM | Admit: 2017-10-03 | Discharge: 2017-10-03 | Disposition: A | Discharge status: Home / Self Care | Payer: 59 | Attending: Family Medicine | Admitting: Family Medicine

## 2017-10-03 DIAGNOSIS — J029 Acute pharyngitis, unspecified: Secondary | ICD-10-CM | POA: Diagnosis not present

## 2017-10-03 LAB — RAPID STREP SCREEN (MED CTR MEBANE ONLY): STREPTOCOCCUS, GROUP A SCREEN (DIRECT): NEGATIVE

## 2017-10-03 MED ORDER — AZITHROMYCIN 250 MG PO TABS: ORAL_TABLET | ORAL | 0 refills | Status: DC

## 2017-10-03 MED ORDER — FLUCONAZOLE 150 MG PO TABS: 150.0000 mg | ORAL_TABLET | Freq: Once | ORAL | 0 refills | Status: AC

## 2017-10-03 NOTE — Discharge Instructions (Signed)
Antibiotic as prescribed.  Take care  Dr. Itzabella Sorrels  

## 2017-10-03 NOTE — ED Provider Notes (Signed)
[facility]    CSN: [id number] Arrival date & time: 10/03/17  1211  History   Chief Complaint Chief Complaint  Patient presents with  . Sore Throat   HPI  46 year old female presents with the above complaint.  Patient states that she has recently been having difficulty with her sinuses.  She attributes this mostly to allergies.  Yesterday she developed severe sore throat.  No associated fever.  She been taking Tylenol and over-the-counter cough medicine for her symptoms.  No known exacerbating relieving factors.  She reports numerous sick contacts.  No other combines or concerns at this time.  PMH: Allergies; Recurrent Sinusitis  Past Surgical History:  Procedure Laterality Date  . NO PAST SURGERIES      OB History    Gravida Para Term Preterm AB Living   1 0     1     SAB TAB Ectopic Multiple Live Births                 Home Medications    Prior to Admission medications   Medication Sig Start Date End Date Taking? Authorizing Provider  famotidine (PEPCID) 20 MG tablet Take 1 tablet (20 mg total) by mouth 2 (two) times daily. 06/19/15  Yes Domenick GongMortenson, Ashley, MD  fluticasone (FLONASE) 50 MCG/ACT nasal spray Place 2 sprays into both nostrils daily. 07/03/17  Yes Domenick GongMortenson, Ashley, MD  ibuprofen (ADVIL,MOTRIN) 600 MG tablet Take 1 tablet (600 mg total) by mouth every 6 (six) hours as needed. 07/03/17  Yes Domenick GongMortenson, Ashley, MD  levonorgestrel-ethinyl estradiol (SEASONALE,INTROVALE,JOLESSA) 0.15-0.03 MG tablet Take 1 tablet by mouth daily.   Yes [provider]  loratadine (CLARITIN) 10 MG tablet Take 10 mg by mouth daily.   Yes [provider]  azithromycin (ZITHROMAX) 250 MG tablet 2 tablets on day 1, then 1 tablet daily on days 2-5. 10/03/17   Tommie Samsook, Yanel Dombrosky G, DO    Family History Family History  Problem Relation Age of Onset  . Irritable bowel syndrome Mother   . Nephrolithiasis Mother   . Heart attack Father     Social History Social  History   Tobacco Use  . Smoking status: Former Smoker    Last attempt to quit: 11/04/1985    Years since quitting: 31.9  . Smokeless tobacco: Never Used  Substance Use Topics  . Alcohol use: Yes    Comment: occasionally  . Drug use: No   Allergies   Penicillins and Shellfish allergy   Review of Systems Review of Systems  Constitutional: Negative for chills and fever.  HENT: Positive for congestion, sinus pressure, sinus pain and sore throat.    Physical Exam Triage Vital Signs ED Triage Vitals  Enc Vitals Group     BP 10/03/17 1236 125/85     Pulse Rate 10/03/17 1236 83     Resp 10/03/17 1236 16     Temp 10/03/17 1236 98.1 F (36.7 C)     Temp Source 10/03/17 1236 Oral     SpO2 10/03/17 1236 99 %     Weight 10/03/17 1234 149 lb (67.6 kg)     Height 10/03/17 1234 5\' 4"  (1.626 m)     Head Circumference --      Peak Flow --      Pain Score 10/03/17 1234 9     Pain Loc --      Pain Edu? --      Excl. in GC? --    Updated Vital Signs BP 125/85 (  BP Location: Left Arm)   Pulse 83   Temp 98.1 F (36.7 C) (Oral)   Resp 16   Ht 5\' 4"  (1.626 m)   Wt 149 lb (67.6 kg)   SpO2 99%   BMI 25.58 kg/m   Physical Exam  Constitutional: She is oriented to person, place, and time. She appears well-developed. No distress.  HENT:  Head: Normocephalic and atraumatic.  Oropharynx with tonsillar edema and exudate.  Cardiovascular: Normal rate and regular rhythm.  No murmur heard. Pulmonary/Chest: Effort normal and breath sounds normal. She has no wheezes. She has no rales.  Neurological: She is alert and oriented to person, place, and time.  Skin: Skin is warm. No rash noted.  Psychiatric: She has a normal mood and affect. Her behavior is normal.  Vitals reviewed.  UC Treatments / Results  Labs (all labs ordered are listed, but only abnormal results are displayed) Labs Reviewed  RAPID STREP SCREEN (NOT AT [facility])  CULTURE, GROUP A STREP [facility])    EKG  EKG  Interpretation None       Radiology No results found.  Procedures Procedures (including critical care time)  Medications Ordered in UC Medications - No data to display   Initial Impression / Assessment and Plan / UC Course  I have reviewed the triage vital signs and the nursing notes.  Pertinent labs & imaging results that were available during my care of the patient were reviewed by me and considered in my medical decision making (see chart for details).     46 year old female presents with sinus issues and pharyngitis. Rapid strep negative. Treating with Azithromycin.  Final Clinical Impressions(s) / UC Diagnoses   Final diagnoses:  Pharyngitis, unspecified etiology   ED Discharge Orders        Ordered    azithromycin (ZITHROMAX) 250 MG tablet     10/03/17 1334     Controlled Substance Prescriptions [place] Controlled Substance Registry consulted? Not Applicable   Tommie SamsCook, Arisa Congleton G, DO 10/03/17 1408

## 2017-10-03 NOTE — ED Triage Notes (Signed)
As per patient has light headache, sore throat, ear pain both side onset yesterday off and on chills.

## 2017-10-06 LAB — CULTURE, GROUP A STREP (THRC)

## 2017-12-04 ENCOUNTER — Other Ambulatory Visit: Payer: Self-pay

## 2017-12-04 ENCOUNTER — Encounter: Payer: Self-pay | Admitting: Emergency Medicine

## 2017-12-04 ENCOUNTER — Ambulatory Visit
Admission: EM | Admit: 2017-12-04 | Discharge: 2017-12-04 | Disposition: A | Discharge status: Home / Self Care | Payer: 59 | Attending: Family Medicine | Admitting: Family Medicine

## 2017-12-04 DIAGNOSIS — B9689 Other specified bacterial agents as the cause of diseases classified elsewhere: Secondary | ICD-10-CM | POA: Diagnosis not present

## 2017-12-04 DIAGNOSIS — N760 Acute vaginitis: Secondary | ICD-10-CM | POA: Diagnosis not present

## 2017-12-04 DIAGNOSIS — N898 Other specified noninflammatory disorders of vagina: Secondary | ICD-10-CM | POA: Diagnosis not present

## 2017-12-04 DIAGNOSIS — N3000 Acute cystitis without hematuria: Secondary | ICD-10-CM

## 2017-12-04 DIAGNOSIS — R300 Dysuria: Secondary | ICD-10-CM | POA: Diagnosis not present

## 2017-12-04 DIAGNOSIS — R350 Frequency of micturition: Secondary | ICD-10-CM | POA: Diagnosis not present

## 2017-12-04 HISTORY — DX: Gastro-esophageal reflux disease without esophagitis: K21.9

## 2017-12-04 LAB — URINALYSIS, COMPLETE (UACMP) WITH MICROSCOPIC
Bilirubin Urine: NEGATIVE
Glucose, UA: NEGATIVE mg/dL
Ketones, ur: NEGATIVE mg/dL
Nitrite: NEGATIVE
PH: 7 (ref 5.0–8.0)
Protein, ur: NEGATIVE mg/dL
SPECIFIC GRAVITY, URINE: 1.02 (ref 1.005–1.030)

## 2017-12-04 LAB — WET PREP, GENITAL
SPERM: NONE SEEN
TRICH WET PREP: NONE SEEN
YEAST WET PREP: NONE SEEN

## 2017-12-04 MED ORDER — FLUCONAZOLE 150 MG PO TABS: 150.0000 mg | ORAL_TABLET | Freq: Every day | ORAL | 0 refills | Status: DC

## 2017-12-04 MED ORDER — METRONIDAZOLE 500 MG PO TABS: 500.0000 mg | ORAL_TABLET | Freq: Two times a day (BID) | ORAL | 0 refills | Status: DC

## 2017-12-04 MED ORDER — SULFAMETHOXAZOLE-TRIMETHOPRIM 800-160 MG PO TABS: 1.0000 | ORAL_TABLET | Freq: Two times a day (BID) | ORAL | 0 refills | Status: DC

## 2017-12-04 NOTE — ED Provider Notes (Signed)
[facility]    CSN: [id number] Arrival date & time: 12/04/17  1819     History   Chief Complaint Chief Complaint  Patient presents with  . Urinary Frequency    HPI Alyssa Valencia is a 47 y.o. female.   The history is provided by the patient.  Urinary Frequency  Pertinent negatives include no abdominal pain.  Dysuria  Pain quality:  Burning Onset quality:  Sudden Duration:  7 days Timing:  Constant Chronicity:  New Recent urinary tract infections: no   Relieved by:  None tried Ineffective treatments:  None tried Urinary symptoms: no discolored urine, no foul-smelling urine, no frequent urination, no hematuria, no hesitancy and no bladder incontinence   Associated symptoms: vaginal discharge   Associated symptoms: no abdominal pain, no fever, no flank pain, no genital lesions, no nausea and no vomiting   Risk factors: no hx of pyelonephritis, no hx of urolithiasis, no kidney transplant, not pregnant, no recurrent urinary tract infections, no renal cysts, no renal disease, no single kidney and no urinary catheter     Past Medical History:  Diagnosis Date  . GERD (gastroesophageal reflux disease)   . Patient denies medical problems   . UTI (lower urinary tract infection)     There are no active problems to display for this patient.   Past Surgical History:  Procedure Laterality Date  . NO PAST SURGERIES      OB History    Gravida Para Term Preterm AB Living   1 0     1     SAB TAB Ectopic Multiple Live Births                   Home Medications    Prior to Admission medications   Medication Sig Start Date End Date Taking? Authorizing Provider  Cimetidine (TAGAMET PO) Take 1 tablet by mouth daily.   Yes [provider]  famotidine (PEPCID) 20 MG tablet Take 1 tablet (20 mg total) by mouth 2 (two) times daily. 06/19/15  Yes Domenick GongMortenson, Ashley, MD  fluticasone (FLONASE) 50 MCG/ACT nasal spray Place 2 sprays into both nostrils daily. 07/03/17   Yes Domenick GongMortenson, Ashley, MD  levonorgestrel-ethinyl estradiol (SEASONALE,INTROVALE,JOLESSA) 0.15-0.03 MG tablet Take 1 tablet by mouth daily.   Yes [provider]  azithromycin (ZITHROMAX) 250 MG tablet 2 tablets on day 1, then 1 tablet daily on days 2-5. 10/03/17   Tommie Samsook, Jayce G, DO  fluconazole (DIFLUCAN) 150 MG tablet Take 1 tablet (150 mg total) by mouth daily. 12/04/17   Payton Mccallumonty, Jedaiah Rathbun, MD  ibuprofen (ADVIL,MOTRIN) 600 MG tablet Take 1 tablet (600 mg total) by mouth every 6 (six) hours as needed. 07/03/17   Domenick GongMortenson, Ashley, MD  loratadine (CLARITIN) 10 MG tablet Take 10 mg by mouth daily.    [provider]  metroNIDAZOLE (FLAGYL) 500 MG tablet Take 1 tablet (500 mg total) by mouth 2 (two) times daily. 12/04/17   Payton Mccallumonty, Lawrence Roldan, MD  sulfamethoxazole-trimethoprim (BACTRIM DS,SEPTRA DS) 800-160 MG tablet Take 1 tablet by mouth 2 (two) times daily. 12/04/17   Payton Mccallumonty, Christorpher Hisaw, MD    Family History Family History  Problem Relation Age of Onset  . Irritable bowel syndrome Mother   . Nephrolithiasis Mother   . Heart attack Father     Social History Social History   Tobacco Use  . Smoking status: Former Smoker    Last attempt to quit: 11/04/1985    Years since quitting: 32.1  . Smokeless tobacco: Never Used  Substance Use Topics  . Alcohol use: Yes    Alcohol/week: 1.8 oz    Types: 3 Glasses of wine per week  . Drug use: No     Allergies   Penicillins and Shellfish allergy   Review of Systems Review of Systems  Constitutional: Negative for fever.  Gastrointestinal: Negative for abdominal pain, nausea and vomiting.  Genitourinary: Positive for dysuria, frequency and vaginal discharge. Negative for flank pain.     Physical Exam Triage Vital Signs ED Triage Vitals  Enc Vitals Group     BP 12/04/17 1832 (!) 159/73     Pulse Rate 12/04/17 1832 (!) 55     Resp 12/04/17 1832 16     Temp 12/04/17 1832 98.2 F (36.8 C)     Temp Source 12/04/17 1832 Oral      SpO2 12/04/17 1832 100 %     Weight 12/04/17 1833 152 lb (68.9 kg)     Height 12/04/17 1833 5\' 4"  (1.626 m)     Head Circumference --      Peak Flow --      Pain Score 12/04/17 1833 7     Pain Loc --      Pain Edu? --      Excl. in GC? --    No data found.  Updated Vital Signs BP (!) 159/73 (BP Location: Left Arm)   Pulse (!) 55   Temp 98.2 F (36.8 C) (Oral)   Resp 16   Ht 5\' 4"  (1.626 m)   Wt 152 lb (68.9 kg)   LMP 08/19/2017 (Approximate)   SpO2 100%   BMI 26.09 kg/m   Visual Acuity Right Eye Distance:   Left Eye Distance:   Bilateral Distance:    Right Eye Near:   Left Eye Near:    Bilateral Near:     Physical Exam  Constitutional: She appears well-developed and well-nourished. No distress.  HENT:  Right Ear: Tympanic membrane normal.  Left Ear: Tympanic membrane normal.  Mouth/Throat: Oropharynx is clear and moist.  Cardiovascular: Normal rate, regular rhythm and normal heart sounds.  Pulmonary/Chest: Effort normal and breath sounds normal. No stridor. No respiratory distress. She has no wheezes. She has no rales.  Abdominal: Soft. Bowel sounds are normal. She exhibits no distension and no mass. There is no tenderness. There is no rebound and no guarding.  Skin: She is not diaphoretic.  Nursing note and vitals reviewed.    UC Treatments / Results  Labs (all labs ordered are listed, but only abnormal results are displayed) Labs Reviewed  WET PREP, GENITAL - Abnormal; Notable for the following components:      Result Value   Clue Cells Wet Prep HPF POC PRESENT (*)    WBC, Wet Prep HPF POC MODERATE (*)    All other components within normal limits  URINALYSIS, COMPLETE (UACMP) WITH MICROSCOPIC - Abnormal; Notable for the following components:   Hgb urine dipstick MODERATE (*)    Leukocytes, UA MODERATE (*)    Squamous Epithelial / LPF 6-30 (*)    Bacteria, UA FEW (*)    All other components within normal limits  URINE CULTURE    EKG  EKG  Interpretation None       Radiology No results found.  Procedures Procedures (including critical care time)  Medications Ordered in UC Medications - No data to display   Initial Impression / Assessment and Plan / UC Course  I have reviewed the triage vital signs and the nursing  notes.  Pertinent labs & imaging results that were available during my care of the patient were reviewed by me and considered in my medical decision making (see chart for details).       Final Clinical Impressions(s) / UC Diagnoses   Final diagnoses:  Acute cystitis without hematuria  BV (bacterial vaginosis)    ED Discharge Orders        Ordered    metroNIDAZOLE (FLAGYL) 500 MG tablet  2 times daily     12/04/17 1929    sulfamethoxazole-trimethoprim (BACTRIM DS,SEPTRA DS) 800-160 MG tablet  2 times daily     12/04/17 1929    fluconazole (DIFLUCAN) 150 MG tablet  Daily     12/04/17 1929     1. Lab results and diagnosis reviewed with patient 2. rx as per orders above; reviewed possible side effects, interactions, risks and benefits  3. Follow-up prn if symptoms worsen or don't improve  Controlled Substance Prescriptions [place] Controlled Substance Registry consulted? Not Applicable   Payton Mccallum, MD 12/04/17 1949

## 2017-12-04 NOTE — ED Triage Notes (Addendum)
Patient in tonight c/o 7-10 days of urinary frequency, urgency and dysuria. Patient denies fever, but has felt feverish off and on. Patient also states that she has had a slight vaginal discharge x 3-4 days with an odor. Unsure if odor is from urine or discharge. Patient also c/o sinus congestion x 1 week.

## 2017-12-06 LAB — URINE CULTURE

## 2018-03-10 ENCOUNTER — Telehealth: Payer: Self-pay

## 2018-03-10 ENCOUNTER — Other Ambulatory Visit: Payer: Self-pay

## 2018-03-10 ENCOUNTER — Ambulatory Visit
Admission: EM | Admit: 2018-03-10 | Discharge: 2018-03-10 | Disposition: A | Discharge status: Home / Self Care | Payer: 59 | Attending: Family Medicine | Admitting: Family Medicine

## 2018-03-10 DIAGNOSIS — J069 Acute upper respiratory infection, unspecified: Secondary | ICD-10-CM

## 2018-03-10 MED ORDER — HYDROCOD POLST-CPM POLST ER 10-8 MG/5ML PO SUER: 5.0000 mL | Freq: Two times a day (BID) | ORAL | 0 refills | Status: DC

## 2018-03-10 MED ORDER — AZITHROMYCIN 250 MG PO TABS: 250.0000 mg | ORAL_TABLET | Freq: Every day | ORAL | 0 refills | Status: DC

## 2018-03-10 MED ORDER — FLUCONAZOLE 150 MG PO TABS: ORAL_TABLET | ORAL | 0 refills | Status: DC

## 2018-03-10 MED ORDER — BENZONATATE 200 MG PO CAPS: ORAL_CAPSULE | ORAL | 0 refills | Status: DC

## 2018-03-10 NOTE — ED Triage Notes (Signed)
Pt with 4 days of runny nose, bloody secretions at times (thinks she has a sore in her nose), now with a cough productive of yellow-green.

## 2018-03-10 NOTE — ED Provider Notes (Signed)
[facility]    CSN: [id number] Arrival date & time: 03/10/18  0947     History   Chief Complaint Chief Complaint  Patient presents with  . Cough    HPI 47 year old female presents with an 7- 8 days history of runny nose which is bloody at times (patient is using Flonase) and a cough of 4 days duration producing yellow-green sputum.  He has been feverish at home but has been using over-the-counter preparations.  O2 sats are 100% on room air today.  She is a non-smoker. He does complain of chest pain and back pain with the excessive coughing.         Alyssa Valencia is a 47 y.o. female.   HPI  Past Medical History:  Diagnosis Date  . GERD (gastroesophageal reflux disease)   . Patient denies medical problems   . UTI (lower urinary tract infection)     There are no active problems to display for this patient.   Past Surgical History:  Procedure Laterality Date  . NO PAST SURGERIES      OB History    Gravida  1   Para  0   Term      Preterm      AB  1   Living        SAB      TAB      Ectopic      Multiple      Live Births               Home Medications    Prior to Admission medications   Medication Sig Start Date End Date Taking? Authorizing Provider  levocetirizine (XYZAL) 5 MG tablet Take 5 mg by mouth every evening.   Yes [provider]  ranitidine (ZANTAC) 75 MG tablet Take 75 mg by mouth 2 (two) times daily.   Yes [provider]  azithromycin (ZITHROMAX) 250 MG tablet Take 1 tablet (250 mg total) by mouth daily. Take first 2 tablets together, then 1 every day until finished. 03/10/18   Lutricia Feil, PA-C  benzonatate (TESSALON) 200 MG capsule Take one cap TID PRN cough 03/10/18   Lutricia Feil, PA-C  chlorpheniramine-HYDROcodone [facility] ER) 10-8 MG/5ML SUER Take 5 mLs by mouth 2 (two) times daily. 03/10/18   Lutricia Feil, PA-C  fluticasone (FLONASE) 50 MCG/ACT nasal spray Place 2  sprays into both nostrils daily. 07/03/17   Domenick Gong, MD  ibuprofen (ADVIL,MOTRIN) 600 MG tablet Take 1 tablet (600 mg total) by mouth every 6 (six) hours as needed. 07/03/17   Domenick Gong, MD  levonorgestrel-ethinyl estradiol (SEASONALE,INTROVALE,JOLESSA) 0.15-0.03 MG tablet Take 1 tablet by mouth daily.    [provider]    Family History Family History  Problem Relation Age of Onset  . Irritable bowel syndrome Mother   . Nephrolithiasis Mother   . Heart attack Father     Social History Social History   Tobacco Use  . Smoking status: Former Smoker    Last attempt to quit: 11/04/1985    Years since quitting: 32.3  . Smokeless tobacco: Never Used  Substance Use Topics  . Alcohol use: Yes    Alcohol/week: 1.8 oz    Types: 3 Glasses of wine per week  . Drug use: No     Allergies   Penicillins and Shellfish allergy   Review of Systems Review of Systems  Constitutional: Positive for activity change, fatigue and fever. Negative for chills.  HENT: Positive for congestion and postnasal drip.   Respiratory: Positive for cough. Negative for shortness of breath.   All other systems reviewed and are negative.    Physical Exam Triage Vital Signs ED Triage Vitals  Enc Vitals Group     BP 03/10/18 0956 (!) 145/88     Pulse Rate 03/10/18 0956 65     Resp 03/10/18 0956 16     Temp 03/10/18 0956 98.1 F (36.7 C)     Temp Source 03/10/18 0956 Oral     SpO2 03/10/18 0956 100 %     Weight 03/10/18 0955 143 lb (64.9 kg)     Height 03/10/18 0955  (1.626 m)     Head Circumference --      Peak Flow --      Pain Score 03/10/18 0955 2     Pain Loc --      Pain Edu? --      Excl. in GC? --    No data found.  Updated Vital Signs BP (!) 145/88 (BP Location: Left Arm)   Pulse 65   Temp 98.1 F (36.7 C) (Oral)   Resp 16   Ht  (1.626 m)   Wt 143 lb (64.9 kg)   SpO2 100%   BMI 24.55 kg/m   Visual Acuity Right Eye Distance:   Left Eye  Distance:   Bilateral Distance:    Right Eye Near:   Left Eye Near:    Bilateral Near:     Physical Exam  Constitutional: She is oriented to person, place, and time. She appears well-developed and well-nourished. No distress.  HENT:  Head: Normocephalic.  Right Ear: External ear normal.  Left Ear: External ear normal.  Nose: Nose normal.  Mouth/Throat: Oropharynx is clear and moist. No oropharyngeal exudate.  Is a tenderness to percussion over the maxillary sinuses  Eyes: Pupils are equal, round, and reactive to light. Right eye exhibits no discharge. Left eye exhibits no discharge.  Neck: Normal range of motion.  Pulmonary/Chest: Effort normal and breath sounds normal.  Musculoskeletal: Normal range of motion.  Lymphadenopathy:    She has no cervical adenopathy.  Neurological: She is alert and oriented to person, place, and time.  Skin: Skin is warm and dry. She is not diaphoretic.  Psychiatric: She has a normal mood and affect. Her behavior is normal. Judgment and thought content normal.  Nursing note and vitals reviewed.    UC Treatments / Results  Labs (all labs ordered are listed, but only abnormal results are displayed) Labs Reviewed - No data to display  EKG None  Radiology No results found.  Procedures Procedures (including critical care time)  Medications Ordered in UC Medications - No data to display  Initial Impression / Assessment and Plan / UC Course  I have reviewed the triage vital signs and the nursing notes.  Pertinent labs & imaging results that were available during my care of the patient were reviewed by me and considered in my medical decision making (see chart for details).     Plan: 1. Test/x-ray results and diagnosis reviewed with patient 2. rx as per orders; risks, benefits, potential side effects reviewed with patient 3. Recommend supportive treatment with the use of Flonase.  Force fluids. Cover with cough suppressant medication.  If  not improving follow-up with primary care physician. 4. F/u prn if symptoms worsen or don't improve    Final Clinical Impressions(s) / UC Diagnoses   Final diagnoses:  Upper  respiratory tract infection, unspecified type   Discharge Instructions   None    ED Prescriptions    Medication Sig Dispense Auth. Provider   azithromycin (ZITHROMAX) 250 MG tablet Take 1 tablet (250 mg total) by mouth daily. Take first 2 tablets together, then 1 every day until finished. 6 tablet Lutricia Feil, PA-C   benzonatate (TESSALON) 200 MG capsule Take one cap TID PRN cough 30 capsule Lutricia Feil, PA-C   chlorpheniramine-HYDROcodone (TUSSIONEX PENNKINETIC ER) 10-8 MG/5ML SUER Take 5 mLs by mouth 2 (two) times daily. 115 mL Lutricia Feil, PA-C     Controlled Substance Prescriptions [place] Controlled Substance Registry consulted? Not Applicable   Lutricia Feil, PA-C 03/10/18 1056

## 2018-04-23 ENCOUNTER — Other Ambulatory Visit: Payer: Self-pay

## 2018-04-23 ENCOUNTER — Encounter: Payer: Self-pay | Admitting: Emergency Medicine

## 2018-04-23 ENCOUNTER — Ambulatory Visit
Admission: EM | Admit: 2018-04-23 | Discharge: 2018-04-23 | Disposition: A | Discharge status: Home / Self Care | Payer: 59 | Attending: Family Medicine | Admitting: Family Medicine

## 2018-04-23 DIAGNOSIS — N3001 Acute cystitis with hematuria: Secondary | ICD-10-CM

## 2018-04-23 LAB — URINALYSIS, COMPLETE (UACMP) WITH MICROSCOPIC
Bilirubin Urine: NEGATIVE
GLUCOSE, UA: NEGATIVE mg/dL
NITRITE: POSITIVE — AB
PH: 5.5 (ref 5.0–8.0)
Protein, ur: NEGATIVE mg/dL
RBC / HPF: NONE SEEN RBC/hpf (ref 0–5)
Specific Gravity, Urine: 1.02 (ref 1.005–1.030)

## 2018-04-23 MED ORDER — NITROFURANTOIN MONOHYD MACRO 100 MG PO CAPS: 100.0000 mg | ORAL_CAPSULE | Freq: Two times a day (BID) | ORAL | 0 refills | Status: DC

## 2018-04-23 MED ORDER — FLUCONAZOLE 150 MG PO TABS: 150.0000 mg | ORAL_TABLET | Freq: Once | ORAL | 0 refills | Status: AC

## 2018-04-23 NOTE — ED Triage Notes (Signed)
Patient c/o dysuria, frequency and burning with urination that started on Monday.

## 2018-04-23 NOTE — ED Provider Notes (Signed)
[facility]    CSN: [id number] Arrival date & time: 04/23/18  1032  History   Chief Complaint Chief Complaint  Patient presents with  . Dysuria   HPI   47 year old female presents with dysuria, urgency, and frequency.  Patient has a history of recurrent UTI.  She states that her symptoms started on Monday.  She reports urgency, frequency, dysuria.  No medications or interventions tried.  No fever.  No abdominal pain.  No flank pain.  No reports of hematuria.  No known exacerbating factors.  No other associated symptoms.  No other complaints.  Past Medical History:  Diagnosis Date  . GERD (gastroesophageal reflux disease)   . UTI (lower urinary tract infection)    Past Surgical History:  Procedure Laterality Date  . NO PAST SURGERIES      OB History    Gravida  1   Para  0   Term      Preterm      AB  1   Living        SAB      TAB      Ectopic      Multiple      Live Births             Home Medications    Prior to Admission medications   Medication Sig Start Date End Date Taking? Authorizing Provider  levocetirizine (XYZAL) 5 MG tablet Take 5 mg by mouth every evening.   Yes [provider]  levonorgestrel-ethinyl estradiol (SEASONALE,INTROVALE,JOLESSA) 0.15-0.03 MG tablet Take 1 tablet by mouth daily.   Yes [provider]  ranitidine (ZANTAC) 75 MG tablet Take 75 mg by mouth 2 (two) times daily.   Yes [provider]  benzonatate (TESSALON) 200 MG capsule Take one cap TID PRN cough 03/10/18   Lutricia Feil, PA-C  chlorpheniramine-HYDROcodone [facility]) 10-8 MG/5ML SUER Take 5 mLs by mouth 2 (two) times daily. 03/10/18   Lutricia Feil, PA-C  fluconazole (DIFLUCAN) 150 MG tablet Take 1 tablet (150 mg total) by mouth once for 1 dose. Repeat dose in 72 hours. 04/23/18 04/23/18  Tommie Sams, DO  nitrofurantoin, macrocrystal-monohydrate, (MACROBID) 100 MG capsule Take 1 capsule (100 mg total) by  mouth 2 (two) times daily. 04/23/18   Tommie Sams, DO   Family History Family History  Problem Relation Age of Onset  . Irritable bowel syndrome Mother   . Nephrolithiasis Mother   . Heart attack Father     Social History Social History   Tobacco Use  . Smoking status: Former Smoker    Last attempt to quit: 11/04/1985    Years since quitting: 32.4  . Smokeless tobacco: Never Used  Substance Use Topics  . Alcohol use: Yes    Alcohol/week: 1.8 oz    Types: 3 Glasses of wine per week  . Drug use: No     Allergies   Penicillins and Shellfish allergy   Review of Systems Review of Systems  Constitutional: Negative for fever.  Genitourinary: Positive for dysuria, frequency and urgency. Negative for flank pain.   Physical Exam Triage Vital Signs ED Triage Vitals  Enc Vitals Group     BP 04/23/18 1045 (!) 128/92     Pulse Rate 04/23/18 1045 73     Resp 04/23/18 1045 18     Temp 04/23/18 1045 98.2 F (36.8 C)     Temp Source 04/23/18 1045 Oral     SpO2 04/23/18  1045 99 %     Weight 04/23/18 1046 143 lb (64.9 kg)     Height 04/23/18 1046 5\' 4"  (1.626 m)     Head Circumference --      Peak Flow --      Pain Score 04/23/18 1045 8     Pain Loc --      Pain Edu? --      Excl. in GC? --    Updated Vital Signs BP (!) 128/92 (BP Location: Left Arm)   Pulse 73   Temp 98.2 F (36.8 C) (Oral)   Resp 18   Ht 5\' 4"  (1.626 m)   Wt 143 lb (64.9 kg)   LMP 01/03/2018 (Approximate)   SpO2 99%   BMI 24.55 kg/m   Visual Acuity Right Eye Distance:   Left Eye Distance:   Bilateral Distance:    Right Eye Near:   Left Eye Near:    Bilateral Near:     Physical Exam  Constitutional: She is oriented to person, place, and time. She appears well-developed. No distress.  Cardiovascular: Normal rate and regular rhythm.  Pulmonary/Chest: Effort normal and breath sounds normal. She has no wheezes. She has no rales.  Abdominal: Soft. She exhibits no distension. There is no  tenderness.  Neurological: She is alert and oriented to person, place, and time.  Psychiatric: She has a normal mood and affect. Her behavior is normal.  Nursing note and vitals reviewed.  UC Treatments / Results  Labs (all labs ordered are listed, but only abnormal results are displayed) Labs Reviewed  URINALYSIS, COMPLETE (UACMP) WITH MICROSCOPIC - Abnormal; Notable for the following components:      Result Value   APPearance HAZY (*)    Hgb urine dipstick MODERATE (*)    Ketones, ur TRACE (*)    Nitrite POSITIVE (*)    Leukocytes, UA SMALL (*)    Bacteria, UA FEW (*)    All other components within normal limits  URINE CULTURE    EKG None  Radiology No results found.  Procedures Procedures (including critical care time)  Medications Ordered in UC Medications - No data to display  Initial Impression / Assessment and Plan / UC Course  I have reviewed the triage vital signs and the nursing notes.  Pertinent labs & imaging results that were available during my care of the patient were reviewed by me and considered in my medical decision making (see chart for details).    47 year old female presents with UTI.  Treating with Macrobid.  Final Clinical Impressions(s) / UC Diagnoses   Final diagnoses:  Acute cystitis with hematuria   Discharge Instructions   None    ED Prescriptions    Medication Sig Dispense Auth. Provider   nitrofurantoin, macrocrystal-monohydrate, (MACROBID) 100 MG capsule Take 1 capsule (100 mg total) by mouth 2 (two) times daily. 14 capsule Idelle Reimann G, DO   fluconazole (DIFLUCAN) 150 MG tablet Take 1 tablet (150 mg total) by mouth once for 1 dose. Repeat dose in 72 hours. 2 tablet Tommie Samsook, Lyna Laningham G, DO     Controlled Substance Prescriptions [place] Controlled Substance Registry consulted? Not Applicable   Tommie SamsCook, Kahdijah Errickson G, DO 04/23/18 1130

## 2018-04-25 LAB — URINE CULTURE

## 2018-04-26 ENCOUNTER — Telehealth (HOSPITAL_COMMUNITY): Payer: Self-pay

## 2018-04-26 NOTE — Telephone Encounter (Signed)
Urine culture was positive for E.Coli and was given Macrobid at urgent care visit. Pt contacted and made aware, educated on completing antibiotic and to follow up if symptoms are persistent. Verbalized understanding.  

## 2018-06-05 ENCOUNTER — Other Ambulatory Visit: Payer: Self-pay

## 2018-06-05 ENCOUNTER — Ambulatory Visit
Admission: EM | Admit: 2018-06-05 | Discharge: 2018-06-05 | Disposition: A | Discharge status: Home / Self Care | Payer: 59 | Attending: Emergency Medicine | Admitting: Emergency Medicine

## 2018-06-05 ENCOUNTER — Encounter: Payer: Self-pay | Admitting: Emergency Medicine

## 2018-06-05 DIAGNOSIS — R319 Hematuria, unspecified: Secondary | ICD-10-CM

## 2018-06-05 DIAGNOSIS — N390 Urinary tract infection, site not specified: Secondary | ICD-10-CM

## 2018-06-05 DIAGNOSIS — N898 Other specified noninflammatory disorders of vagina: Secondary | ICD-10-CM

## 2018-06-05 LAB — WET PREP, GENITAL
CLUE CELLS WET PREP: NONE SEEN
Sperm: NONE SEEN
TRICH WET PREP: NONE SEEN
Yeast Wet Prep HPF POC: NONE SEEN

## 2018-06-05 LAB — URINALYSIS, COMPLETE (UACMP) WITH MICROSCOPIC
Glucose, UA: NEGATIVE mg/dL
KETONES UR: 15 mg/dL — AB
NITRITE: POSITIVE — AB
PH: 5 (ref 5.0–8.0)
SPECIFIC GRAVITY, URINE: 1.025 (ref 1.005–1.030)

## 2018-06-05 MED ORDER — PHENAZOPYRIDINE HCL 200 MG PO TABS: 200.0000 mg | ORAL_TABLET | Freq: Three times a day (TID) | ORAL | 0 refills | Status: DC | PRN

## 2018-06-05 MED ORDER — NITROFURANTOIN MONOHYD MACRO 100 MG PO CAPS: 100.0000 mg | ORAL_CAPSULE | Freq: Two times a day (BID) | ORAL | 0 refills | Status: DC

## 2018-06-05 MED ORDER — FLUCONAZOLE 150 MG PO TABS: 150.0000 mg | ORAL_TABLET | Freq: Once | ORAL | 1 refills | Status: AC

## 2018-06-05 NOTE — ED Provider Notes (Signed)
HPI  SUBJECTIVE:  Alyssa Valencia is a 47 y.o. female who presents with 3 days of dysuria, urgency, frequency, cloudy, odorous urine.  She also reports vaginal odor and odorous vaginal discharge.  She denies hematuria.  No fevers, nausea, vomiting, abdominal, back, pelvic pain.  No vaginal itching, rash, abnormal vaginal bleeding.  She is sexually active with her husband who is asymptomatic.  STDs are not a concern today.  She tried Azo, pushing fluids.  Pushing fluids helped.  No aggravating factors.  She was Macrobid within the last month for UTI.  No antipyretic in the past 6 to 8 hours.  She states that she switched to Vagisil wash, not douche, 2 weeks ago.  Denies any other perfumed soaps or body washes.  She has a past medical history of UTIs, hematuria, yeast infections.  States that she frequently gets these after being placed on antibiotics.  No history of pyelonephritis, nephrolithiasis, gonorrhea, chlamydia, herpes, HIV, syphilis, Trichomonas, BV, diabetes.  LMP: 2 weeks ago.  Denies possibility being pregnant.  ZOX:[id number]:[place]   Patient was seen here on 6/19 and found to have a UTI was placed on Macrobid.  Urine culture grew out E. coli UTI that was pansensitive.   Past Medical History:  Diagnosis Date  . GERD (gastroesophageal reflux disease)   . Patient denies medical problems   . UTI (lower urinary tract infection)     Past Surgical History:  Procedure Laterality Date  . NO PAST SURGERIES      Family History  Problem Relation Age of Onset  . Irritable bowel syndrome Mother   . Nephrolithiasis Mother   . Heart attack Father     Social History   Tobacco Use  . Smoking status: Former Smoker    Last attempt to quit: 11/04/1985    Years since quitting: 32.6  . Smokeless tobacco: Never Used  Substance Use Topics  . Alcohol use: Yes    Alcohol/week: 1.8 oz    Types: 3 Glasses of wine per week  . Drug use: No    No current facility-administered  medications for this encounter.   Current Outpatient Medications:  .  levocetirizine (XYZAL) 5 MG tablet, Take 5 mg by mouth every evening., Disp: , Rfl:  .  levonorgestrel-ethinyl estradiol (SEASONALE,INTROVALE,JOLESSA) 0.15-0.03 MG tablet, Take 1 tablet by mouth daily., Disp: , Rfl:  .  ranitidine (ZANTAC) 75 MG tablet, Take 75 mg by mouth 2 (two) times daily., Disp: , Rfl:  .  fluconazole (DIFLUCAN) 150 MG tablet, Take 1 tablet (150 mg total) by mouth once for 1 dose. 1 tab po x 1. May repeat in 72 hours if no improvement, Disp: 2 tablet, Rfl: 1 .  nitrofurantoin, macrocrystal-monohydrate, (MACROBID) 100 MG capsule, Take 1 capsule (100 mg total) by mouth 2 (two) times daily. X 5 days, Disp: 10 capsule, Rfl: 0 .  phenazopyridine (PYRIDIUM) 200 MG tablet, Take 1 tablet (200 mg total) by mouth 3 (three) times daily as needed for pain., Disp: 6 tablet, Rfl: 0  Allergies  Allergen Reactions  . Penicillins     "Drowsy"  . Shellfish Allergy Rash     ROS  As noted in HPI.   Physical Exam  BP (!) 121/92 (BP Location: Left Arm)   Pulse 75   Temp 98.3 F (36.8 C) (Oral)   Resp 18   Ht 5\' 4"  (1.626 m)   Wt 140 lb (63.5 kg)   LMP 05/22/2018   SpO2 100%   BMI  24.03 kg/m   Constitutional: Well developed, well nourished, no acute distress Eyes:  EOMI, conjunctiva normal bilaterally HENT: Normocephalic, atraumatic,mucus membranes moist Respiratory: Normal inspiratory effort Cardiovascular: Normal rate GI: nondistended no suprapubic or flank tenderness Back: No CVAT GU: Deferred skin: No rash, skin intact Musculoskeletal: no deformities Neurologic: Alert & oriented x 3, no focal neuro deficits Psychiatric: Speech and behavior appropriate   ED Course   Medications - No data to display  Orders Placed This Encounter  Procedures  . Wet prep, genital    Standing Status:   Standing    Number of Occurrences:   1  . Urine culture    Standing Status:   Standing    Number of  Occurrences:   1    Order Specific Question:   List patient's active antibiotics    Answer:   macrobid    Order Specific Question:   Patient immune status    Answer:   Normal  . Urinalysis, Complete w Microscopic    Standing Status:   Standing    Number of Occurrences:   1    Results for orders placed or performed during the hospital encounter of 06/05/18 (from the past 24 hour(s))  Urinalysis, Complete w Microscopic     Status: Abnormal   Collection Time: 06/05/18  5:24 PM  Result Value Ref Range   Color, Urine YELLOW YELLOW   APPearance HAZY (A) CLEAR   Specific Gravity, Urine 1.025 1.005 - 1.030   pH 5.0 5.0 - 8.0   Glucose, UA NEGATIVE NEGATIVE mg/dL   Hgb urine dipstick LARGE (A) NEGATIVE   Bilirubin Urine SMALL (A) NEGATIVE   Ketones, ur 15 (A) NEGATIVE mg/dL   Protein, ur TRACE (A) NEGATIVE mg/dL   Nitrite POSITIVE (A) NEGATIVE   Leukocytes, UA MODERATE (A) NEGATIVE   Squamous Epithelial / LPF 6-10 0 - 5   WBC, UA >50 0 - 5 WBC/hpf   RBC / HPF 6-10 0 - 5 RBC/hpf   Bacteria, UA RARE (A) NONE SEEN  Wet prep, genital     Status: Abnormal   Collection Time: 06/05/18  5:48 PM  Result Value Ref Range   Yeast Wet Prep HPF POC NONE SEEN NONE SEEN   Trich, Wet Prep NONE SEEN NONE SEEN   Clue Cells Wet Prep HPF POC NONE SEEN NONE SEEN   WBC, Wet Prep HPF POC RARE (A) NONE SEEN   Sperm NONE SEEN    No results found.  ED Clinical Impression  Urinary tract infection with hematuria, site unspecified   ED Assessment/Plan  Previous labs, records reviewed.  As noted in HPI.  UA consistent with UTI.  We will also check a wet prep because she is reporting odorous vaginal discharge.  Plan to send home with Macrobid, Pyridium, Diflucan because she states that she always gets yeast infections and she takes antibiotics.  Sending urine off for culture to confirm antibiotic choice.  Wet prep negative.  Plan as above  Discussed labs,  MDM, treatment plan, and plan for follow-up with  patient. Discussed sn/sx that should prompt return to the ED. patient agrees with plan.   Meds ordered this encounter  Medications  . phenazopyridine (PYRIDIUM) 200 MG tablet    Sig: Take 1 tablet (200 mg total) by mouth 3 (three) times daily as needed for pain.    Dispense:  6 tablet    Refill:  0  . nitrofurantoin, macrocrystal-monohydrate, (MACROBID) 100 MG capsule    Sig: Take 1  capsule (100 mg total) by mouth 2 (two) times daily. X 5 days    Dispense:  10 capsule    Refill:  0  . fluconazole (DIFLUCAN) 150 MG tablet    Sig: Take 1 tablet (150 mg total) by mouth once for 1 dose. 1 tab po x 1. May repeat in 72 hours if no improvement    Dispense:  2 tablet    Refill:  1    *This clinic note was created using Scientist, clinical (histocompatibility and immunogenetics). Therefore, there may be occasional mistakes despite careful proofreading.   ?   Domenick Gong, MD 06/05/18 [phone]

## 2018-06-05 NOTE — Discharge Instructions (Addendum)
Wet prep was negative for BV or yeast.  We are sending your urine off for culture to make sure that you are on the right antibiotic.  Make sure you gave us a working phone number so that we can contact you if necessary.  Make sure you continue pushing plenty of extra fluids.  The Pyridium will help with your symptoms.  Will turn your urine orange.

## 2018-06-05 NOTE — ED Triage Notes (Signed)
Patient states she is having frequent and painful urination.

## 2018-06-07 LAB — URINE CULTURE: SPECIAL REQUESTS: NORMAL

## 2018-08-04 ENCOUNTER — Ambulatory Visit
Admission: EM | Admit: 2018-08-04 | Discharge: 2018-08-04 | Disposition: A | Discharge status: Home / Self Care | Payer: 59 | Attending: Family Medicine | Admitting: Family Medicine

## 2018-08-04 ENCOUNTER — Other Ambulatory Visit: Payer: Self-pay

## 2018-08-04 ENCOUNTER — Encounter: Payer: Self-pay | Admitting: Emergency Medicine

## 2018-08-04 DIAGNOSIS — B9689 Other specified bacterial agents as the cause of diseases classified elsewhere: Secondary | ICD-10-CM | POA: Diagnosis not present

## 2018-08-04 DIAGNOSIS — B373 Candidiasis of vulva and vagina: Secondary | ICD-10-CM | POA: Diagnosis not present

## 2018-08-04 DIAGNOSIS — N760 Acute vaginitis: Secondary | ICD-10-CM | POA: Diagnosis not present

## 2018-08-04 DIAGNOSIS — N390 Urinary tract infection, site not specified: Secondary | ICD-10-CM

## 2018-08-04 DIAGNOSIS — B3731 Acute candidiasis of vulva and vagina: Secondary | ICD-10-CM

## 2018-08-04 LAB — URINALYSIS, COMPLETE (UACMP) WITH MICROSCOPIC
Bilirubin Urine: NEGATIVE
Glucose, UA: NEGATIVE mg/dL
Ketones, ur: NEGATIVE mg/dL
Nitrite: NEGATIVE
PROTEIN: NEGATIVE mg/dL
Specific Gravity, Urine: 1.02 (ref 1.005–1.030)
pH: 5 (ref 5.0–8.0)

## 2018-08-04 LAB — WET PREP, GENITAL
Sperm: NONE SEEN
TRICH WET PREP: NONE SEEN

## 2018-08-04 MED ORDER — SULFAMETHOXAZOLE-TRIMETHOPRIM 800-160 MG PO TABS: 1.0000 | ORAL_TABLET | Freq: Two times a day (BID) | ORAL | 0 refills | Status: AC

## 2018-08-04 MED ORDER — METRONIDAZOLE 500 MG PO TABS: 500.0000 mg | ORAL_TABLET | Freq: Two times a day (BID) | ORAL | 0 refills | Status: DC

## 2018-08-04 MED ORDER — PHENAZOPYRIDINE HCL 200 MG PO TABS: 200.0000 mg | ORAL_TABLET | Freq: Three times a day (TID) | ORAL | 0 refills | Status: DC | PRN

## 2018-08-04 MED ORDER — FLUCONAZOLE 200 MG PO TABS: 200.0000 mg | ORAL_TABLET | Freq: Once | ORAL | 1 refills | Status: AC

## 2018-08-04 NOTE — Discharge Instructions (Addendum)
-  Diflucan: one tablet today. May repeat once in seven days if needed -bactrim: one tablet twice a day for seven days -Flagyl: one tablet twice a day for seven days -Pyridium: as needed for bladder pain -push fluids

## 2018-08-04 NOTE — ED Provider Notes (Signed)
[facility]    CSN: [id number] Arrival date & time: 08/04/18  1843     History   Chief Complaint Chief Complaint  Patient presents with  . Vaginal Discharge    HPI Alyssa Valencia is a 47 y.o. female.   Patient is a 47 year old female who presents with complaint of vaginal discharge and odor.  Patient was seen here beginning of June and again August 1 with UTI symptoms as well as vaginal odor.  Patient states her UTI got better however she states she does not feel that the vaginal odor and discharge completely cleared.  She reports that symptoms urinary wise are better and she is not having the difficulty urinating as she had before.  She states the vaginal odor seems to have less than a little bit since the last urgent care visit but seems to have come back again the last several days.  Patient denies any pain with intercourse.  Patient was seen early June and July.  She did show E. coli on the initial culture in June but the August culture had insignificant growth.  Wet prep in June showed a few clue cells but none were noted in August.  Neither wet prep showed any yeast.  In August.,  Was treated again with Macrobid for the UTI and sent home with Diflucan and Pyridium as well.     Past Medical History:  Diagnosis Date  . GERD (gastroesophageal reflux disease)   . Patient denies medical problems   . UTI (lower urinary tract infection)     There are no active problems to display for this patient.   Past Surgical History:  Procedure Laterality Date  . NO PAST SURGERIES      OB History    Gravida  1   Para  0   Term      Preterm      AB  1   Living        SAB      TAB      Ectopic      Multiple      Live Births               Home Medications    Prior to Admission medications   Medication Sig Start Date End Date Taking? Authorizing Provider  levocetirizine (XYZAL) 5 MG tablet Take 5 mg by mouth every evening.   Yes [provider]  levonorgestrel-ethinyl estradiol (SEASONALE,INTROVALE,JOLESSA) 0.15-0.03 MG tablet Take 1 tablet by mouth daily.   Yes [provider]  ranitidine (ZANTAC) 75 MG tablet Take 75 mg by mouth 2 (two) times daily.   Yes [provider]  fluconazole (DIFLUCAN) 200 MG tablet Take 1 tablet (200 mg total) by mouth once for 1 dose. 08/04/18 08/04/18  Candis Schatz, PA-C  metroNIDAZOLE (FLAGYL) 500 MG tablet Take 1 tablet (500 mg total) by mouth 2 (two) times daily. 08/04/18   Candis Schatz, PA-C  phenazopyridine (PYRIDIUM) 200 MG tablet Take 1 tablet (200 mg total) by mouth 3 (three) times daily as needed for pain. 08/04/18   Candis Schatz, PA-C  sulfamethoxazole-trimethoprim (BACTRIM DS,SEPTRA DS) 800-160 MG tablet Take 1 tablet by mouth 2 (two) times daily for 7 days. 08/04/18 08/11/18  Candis Schatz, PA-C    Family History Family History  Problem Relation Age of Onset  . Irritable bowel syndrome Mother   . Nephrolithiasis Mother   . Heart attack Father     Social History Social  History   Tobacco Use  . Smoking status: Former Smoker    Last attempt to quit: 11/04/1985    Years since quitting: 32.7  . Smokeless tobacco: Never Used  Substance Use Topics  . Alcohol use: Yes    Alcohol/week: 3.0 standard drinks    Types: 3 Glasses of wine per week  . Drug use: No     Allergies   Penicillins and Shellfish allergy   Review of Systems Review of Systems as noted above in HPI.  Other system reviewed and found to be negative  Physical Exam Triage Vital Signs ED Triage Vitals  Enc Vitals Group     BP 08/04/18 1924 (!) 155/103     Pulse Rate 08/04/18 1924 69     Resp 08/04/18 1924 17     Temp 08/04/18 1924 98.2 F (36.8 C)     Temp Source 08/04/18 1924 Oral     SpO2 08/04/18 1924 98 %     Weight 08/04/18 1923 141 lb (64 kg)     Height 08/04/18 1923 5\' 4"  (1.626 m)     Head Circumference --      Peak Flow --      Pain Score 08/04/18 1923 0     Pain  Loc --      Pain Edu? --      Excl. in GC? --    No data found.  Updated Vital Signs BP (!) 155/103 (BP Location: Left Arm)   Pulse 69   Temp 98.2 F (36.8 C) (Oral)   Resp 17   Ht 5\' 4"  (1.626 m)   Wt 141 lb (64 kg)   LMP 06/02/2018   SpO2 98%   BMI 24.20 kg/m   Visual Acuity Right Eye Distance:   Left Eye Distance:   Bilateral Distance:    Right Eye Near:   Left Eye Near:    Bilateral Near:     Physical Exam  Constitutional: She is oriented to person, place, and time. She appears well-developed and well-nourished. No distress.  Eyes: EOM are normal.  Neck: Normal range of motion.  Pulmonary/Chest: Effort normal and breath sounds normal.  Abdominal: Soft. She exhibits no distension. There is no tenderness. There is no guarding.  Neurological: She is alert and oriented to person, place, and time.  Skin: Skin is warm and dry.     UC Treatments / Results  Labs (all labs ordered are listed, but only abnormal results are displayed) Labs Reviewed  WET PREP, GENITAL - Abnormal; Notable for the following components:      Result Value   Yeast Wet Prep HPF POC PRESENT (*)    Clue Cells Wet Prep HPF POC PRESENT (*)    WBC, Wet Prep HPF POC RARE (*)    All other components within normal limits  URINALYSIS, COMPLETE (UACMP) WITH MICROSCOPIC - Abnormal; Notable for the following components:   APPearance HAZY (*)    Hgb urine dipstick MODERATE (*)    Leukocytes, UA MODERATE (*)    Bacteria, UA FEW (*)    All other components within normal limits    EKG None  Radiology No results found.  Procedures Procedures (including critical care time)  Medications Ordered in UC Medications - No data to display  Initial Impression / Assessment and Plan / UC Course  I have reviewed the triage vital signs and the nursing notes.  Pertinent labs & imaging results that were available during my care of the patient were reviewed  by me and considered in my medical decision making  (see chart for details).     Patient with UTI based on her urinalysis.  Wet prep also shows signs of bacterial and yeast vaginosis.  We will give her prescription for Bactrim for the UTI, Flagyl for the bacterial vaginosis, and Diflucan for the yeast.  We will give her 7 days of the Bactrim and Flagyl and a one-time dose for the Diflucan with a single refill available for 1 week later.  Have her push fluids.  Final Clinical Impressions(s) / UC Diagnoses   Final diagnoses:  BV (bacterial vaginosis)  Yeast vaginitis  Urinary tract infection without hematuria, site unspecified     Discharge Instructions     -Diflucan: one tablet today. May repeat once in seven days if needed -bactrim: one tablet twice a day for seven days -Flagyl: one tablet twice a day for seven days -Pyridium: as needed for bladder pain -push fluids   ED Prescriptions    Medication Sig Dispense Auth. Provider   fluconazole (DIFLUCAN) 200 MG tablet Take 1 tablet (200 mg total) by mouth once for 1 dose. 1 tablet Candis Schatz, PA-C   sulfamethoxazole-trimethoprim (BACTRIM DS,SEPTRA DS) 800-160 MG tablet Take 1 tablet by mouth 2 (two) times daily for 7 days. 14 tablet Candis Schatz, PA-C   metroNIDAZOLE (FLAGYL) 500 MG tablet Take 1 tablet (500 mg total) by mouth 2 (two) times daily. 14 tablet Candis Schatz, PA-C   phenazopyridine (PYRIDIUM) 200 MG tablet Take 1 tablet (200 mg total) by mouth 3 (three) times daily as needed for pain. 6 tablet Candis Schatz, PA-C     Controlled Substance Prescriptions [place] Controlled Substance Registry consulted? Not Applicable   Candis Schatz, PA-C 08/04/18 2055

## 2018-08-04 NOTE — ED Triage Notes (Signed)
Pt c/o vaginal odor, discharge and dysuria. She changes soaps and unsure if this could be cause. She was seen for the same symptoms on 06/05/18  treated for UTI, wet prep was neg. The urinary symptoms got better but vaginal odor and discharge never did seem to get better.

## 2018-08-25 DIAGNOSIS — K130 Diseases of lips: Secondary | ICD-10-CM | POA: Diagnosis not present

## 2018-08-25 DIAGNOSIS — L814 Other melanin hyperpigmentation: Secondary | ICD-10-CM | POA: Diagnosis not present

## 2018-08-25 DIAGNOSIS — D239 Other benign neoplasm of skin, unspecified: Secondary | ICD-10-CM | POA: Diagnosis not present

## 2018-09-08 DIAGNOSIS — Z01419 Encounter for gynecological examination (general) (routine) without abnormal findings: Secondary | ICD-10-CM | POA: Diagnosis not present

## 2018-09-11 ENCOUNTER — Other Ambulatory Visit: Payer: Self-pay

## 2018-09-11 ENCOUNTER — Ambulatory Visit
Admission: EM | Admit: 2018-09-11 | Discharge: 2018-09-11 | Disposition: A | Discharge status: Home / Self Care | Payer: 59 | Attending: Family Medicine | Admitting: Family Medicine

## 2018-09-11 DIAGNOSIS — J302 Other seasonal allergic rhinitis: Secondary | ICD-10-CM | POA: Diagnosis not present

## 2018-09-11 MED ORDER — CETIRIZINE-PSEUDOEPHEDRINE ER 5-120 MG PO TB12: 1.0000 | ORAL_TABLET | Freq: Two times a day (BID) | ORAL | 0 refills | Status: DC

## 2018-09-11 NOTE — ED Triage Notes (Signed)
Patient complains of sinus pain and pressure, headaches and congestion x 4 days.

## 2018-09-11 NOTE — Discharge Instructions (Addendum)
Return to our clinic if you run high fevers or are not improving.  Continue using Flonase daily for the next 2 to 3 weeks.  Take Zyrtec-D for the congestion discontinue your plain Zyrtec until no longer require the Zyrtec-D.

## 2018-09-11 NOTE — ED Provider Notes (Signed)
[facility]    CSN: [id number] Arrival date & time: 09/11/18  1149     History   Chief Complaint Chief Complaint  Patient presents with  . Sinusitis    HPI Alyssa Valencia is a 47 y.o. female.   HPI  47 year old female well-known to our clinic presents with sinus pain and pressure along with headaches and nasal congestion that she has had for 3 days.  States that she did having congestion and sinus pain.  She started using her Flonase about 2 days ago and continues to take Zyrtec plain.  She has had chills and a feverish feeling but did not take her temperature.  Presently she is afebrile.  Has a history of seasonal allergies and usually gets these symptoms about 2 times a year.  He wanted to come in early so as not to let it go to a sinusitis.         Past Medical History:  Diagnosis Date  . GERD (gastroesophageal reflux disease)   . Patient denies medical problems   . UTI (lower urinary tract infection)     There are no active problems to display for this patient.   Past Surgical History:  Procedure Laterality Date  . NO PAST SURGERIES      OB History    Gravida  1   Para  0   Term      Preterm      AB  1   Living        SAB      TAB      Ectopic      Multiple      Live Births               Home Medications    Prior to Admission medications   Medication Sig Start Date End Date Taking? Authorizing Provider  famotidine (PEPCID) 40 MG tablet Take 40 mg by mouth daily.   Yes [provider]  levocetirizine (XYZAL) 5 MG tablet Take 5 mg by mouth every evening.   Yes [provider]  levonorgestrel-ethinyl estradiol (SEASONALE,INTROVALE,JOLESSA) 0.15-0.03 MG tablet Take 1 tablet by mouth daily.   Yes [provider]  cetirizine-pseudoephedrine (ZYRTEC-D) 5-120 MG tablet Take 1 tablet by mouth 2 (two) times daily. 09/11/18   Lutricia Feil, PA-C    Family History Family History  Problem Relation Age of  Onset  . Irritable bowel syndrome Mother   . Nephrolithiasis Mother   . Heart attack Father     Social History Social History   Tobacco Use  . Smoking status: Former Smoker    Last attempt to quit: 11/04/1985    Years since quitting: 32.8  . Smokeless tobacco: Never Used  Substance Use Topics  . Alcohol use: Yes    Alcohol/week: 3.0 standard drinks    Types: 3 Glasses of wine per week  . Drug use: No     Allergies   Penicillins and Shellfish allergy   Review of Systems Review of Systems  Constitutional: Positive for activity change and chills. Negative for appetite change, diaphoresis, fatigue and fever.  HENT: Positive for congestion, postnasal drip, rhinorrhea, sinus pressure and sinus pain.   Respiratory: Positive for cough.   All other systems reviewed and are negative.    Physical Exam Triage Vital Signs ED Triage Vitals  Enc Vitals Group     BP 09/11/18 1159 (!) 131/94     Pulse Rate 09/11/18 1159 75  Resp 09/11/18 1159 18     Temp 09/11/18 1159 98.2 F (36.8 C)     Temp Source 09/11/18 1159 Oral     SpO2 09/11/18 1159 100 %     Weight 09/11/18 1157 141 lb (64 kg)     Height 09/11/18 1157 5\' 4"  (1.626 m)     Head Circumference --      Peak Flow --      Pain Score 09/11/18 1157 5     Pain Loc --      Pain Edu? --      Excl. in GC? --    No data found.  Updated Vital Signs BP (!) 131/94 (BP Location: Right Arm)   Pulse 75   Temp 98.2 F (36.8 C) (Oral)   Resp 18   Ht 5\' 4"  (1.626 m)   Wt 141 lb (64 kg)   SpO2 100%   BMI 24.20 kg/m   Visual Acuity Right Eye Distance:   Left Eye Distance:   Bilateral Distance:    Right Eye Near:   Left Eye Near:    Bilateral Near:     Physical Exam  Constitutional: She is oriented to person, place, and time. She appears well-developed and well-nourished. No distress.  HENT:  Head: Normocephalic.  Right Ear: External ear normal.  Left Ear: External ear normal.  Nose: Nose normal.  Mouth/Throat:  Oropharynx is clear and moist. No oropharyngeal exudate.  Eyes: Pupils are equal, round, and reactive to light. Right eye exhibits no discharge. Left eye exhibits no discharge.  Neck: Normal range of motion. Neck supple.  Pulmonary/Chest: Effort normal and breath sounds normal.  Musculoskeletal: Normal range of motion.  Neurological: She is oriented to person, place, and time.  Skin: Skin is warm and dry. She is not diaphoretic.  Psychiatric: She has a normal mood and affect. Her behavior is normal. Judgment and thought content normal.  Nursing note and vitals reviewed.    UC Treatments / Results  Labs (all labs ordered are listed, but only abnormal results are displayed) Labs Reviewed - No data to display  EKG None  Radiology No results found.  Procedures Procedures (including critical care time)  Medications Ordered in UC Medications - No data to display  Initial Impression / Assessment and Plan / UC Course  I have reviewed the triage vital signs and the nursing notes.  Pertinent labs & imaging results that were available during my care of the patient were reviewed by me and considered in my medical decision making (see chart for details).   Told patient this is likely activation of her seasonal allergies.  She has not been using Flonase long enough to notice a difference.  We will change her from her plain Zyrtec to Zyrtec-D.  Her headaches have asked her to combine ibuprofen 400 with Tylenol 500 mg for its added effect.  She runs high fevers or is not improving she could return to our clinic for reevaluation.  Otherwise I think that she will see benefit from the Zyrtec-D and the continued use of the Flonase.   Final Clinical Impressions(s) / UC Diagnoses   Final diagnoses:  Seasonal allergies     Discharge Instructions     Return to our clinic if you run high fevers or are not improving.  Continue using Flonase daily for the next 2 to 3 weeks.  Take Zyrtec-D for the  congestion discontinue your plain Zyrtec until no longer require the Zyrtec-D.   ED Prescriptions  Medication Sig Dispense Auth. Provider   cetirizine-pseudoephedrine (ZYRTEC-D) 5-120 MG tablet Take 1 tablet by mouth 2 (two) times daily. 30 tablet Lutricia Feil, PA-C     Controlled Substance Prescriptions [place] Controlled Substance Registry consulted? Not Applicable   Lutricia Feil, PA-C 09/11/18 1304

## 2018-09-15 ENCOUNTER — Encounter: Payer: Self-pay | Admitting: Podiatry

## 2018-09-15 ENCOUNTER — Ambulatory Visit (INDEPENDENT_AMBULATORY_CARE_PROVIDER_SITE_OTHER): Payer: 59 | Admitting: Podiatry

## 2018-09-15 ENCOUNTER — Ambulatory Visit (INDEPENDENT_AMBULATORY_CARE_PROVIDER_SITE_OTHER): Payer: 59

## 2018-09-15 DIAGNOSIS — T7840XA Allergy, unspecified, initial encounter: Secondary | ICD-10-CM | POA: Insufficient documentation

## 2018-09-15 DIAGNOSIS — M779 Enthesopathy, unspecified: Secondary | ICD-10-CM

## 2018-09-15 DIAGNOSIS — M778 Other enthesopathies, not elsewhere classified: Secondary | ICD-10-CM

## 2018-09-15 DIAGNOSIS — M722 Plantar fascial fibromatosis: Secondary | ICD-10-CM

## 2018-09-15 DIAGNOSIS — G43909 Migraine, unspecified, not intractable, without status migrainosus: Secondary | ICD-10-CM | POA: Insufficient documentation

## 2018-09-15 DIAGNOSIS — N390 Urinary tract infection, site not specified: Secondary | ICD-10-CM | POA: Insufficient documentation

## 2018-09-15 MED ORDER — MELOXICAM 15 MG PO TABS: 15.0000 mg | ORAL_TABLET | Freq: Every day | ORAL | 3 refills | Status: DC

## 2018-09-15 MED ORDER — METHYLPREDNISOLONE 4 MG PO TBPK: ORAL_TABLET | ORAL | 0 refills | Status: DC

## 2018-09-15 NOTE — Patient Instructions (Signed)

## 2018-09-16 NOTE — Progress Notes (Signed)
Subjective:  Patient ID: Alyssa Valencia, female    DOB: 1970-12-16,  MRN: [id number] HPI Chief Complaint  Patient presents with  . Foot Pain    Plantar heel bilateral (R>L) - aching x 6 months, AM pain, tried cold compress, pain is more sharp now, tried naproxen but not much relief  . New Patient (Initial Visit)    47 y.o. female presents with the above complaint.   ROS: Denies fever chills nausea vomiting muscle aches pains calf pain back pain chest pain shortness of breath headache.  Past Medical History:  Diagnosis Date  . GERD (gastroesophageal reflux disease)   . Patient denies medical problems   . UTI (lower urinary tract infection)    Past Surgical History:  Procedure Laterality Date  . NO PAST SURGERIES      Current Outpatient Medications:  .  cetirizine-pseudoephedrine (ZYRTEC-D) 5-120 MG tablet, Take 1 tablet by mouth 2 (two) times daily., Disp: 30 tablet, Rfl: 0 .  famotidine (PEPCID) 40 MG tablet, Take 40 mg by mouth daily., Disp: , Rfl:  .  levocetirizine (XYZAL) 5 MG tablet, Take 5 mg by mouth every evening., Disp: , Rfl:  .  levonorgestrel-ethinyl estradiol (SEASONALE,INTROVALE,JOLESSA) 0.15-0.03 MG tablet, Take 1 tablet by mouth daily., Disp: , Rfl:  .  meloxicam (MOBIC) 15 MG tablet, Take 1 tablet (15 mg total) by mouth daily., Disp: 30 tablet, Rfl: 3 .  methylPREDNISolone (MEDROL DOSEPAK) 4 MG TBPK tablet, 6 day dose pack - take as directed, Disp: 21 tablet, Rfl: 0  Allergies  Allergen Reactions  . Penicillins     "Drowsy"  . Shellfish Allergy Rash   Review of Systems Objective:  There were no vitals filed for this visit.  General: Well developed, nourished, in no acute distress, alert and oriented x3   Dermatological: Skin is warm, dry and supple bilateral. Nails x 10 are well maintained; remaining integument appears unremarkable at this time. There are no open sores, no preulcerative lesions, no rash or signs of infection present.  Vascular: Dorsalis  Pedis artery and Posterior Tibial artery pedal pulses are 2/4 bilateral with immedate capillary fill time. Pedal hair growth present. No varicosities and no lower extremity edema present bilateral.   Neruologic: Grossly intact via light touch bilateral. Vibratory intact via tuning fork bilateral. Protective threshold with Semmes Wienstein monofilament intact to all pedal sites bilateral. Patellar and Achilles deep tendon reflexes 2+ bilateral. No Babinski or clonus noted bilateral.   Musculoskeletal: No gross boney pedal deformities bilateral. No pain, crepitus, or limitation noted with foot and ankle range of motion bilateral. Muscular strength 5/5 in all groups tested bilateral.  She has pain on palpation medial calcaneal tubercle of the right heel.  Minimally so on the left.  Gait: Unassisted, Nonantalgic.    Radiographs:  Radiographs taken today of the right foot demonstrate an osseously mature individual small plantar distally oriented calcaneal heel spur soft tissue increase in density plantar fascial calcaneal insertion site.  Assessment & Plan:   Assessment: Plantar fasciitis right  Plan: Discussed etiology pathology conservative surgical therapies at this point we discussed the etiology pathology conservative surgical therapies discussed appropriate shoe gear stretching exercise ice therapy sugar modifications.  After sterile Betadine skin prep I injected 20 mg Kenalog 5 mg Marcaine point maximal tenderness of the right heel.  Tolerated procedure well without complications.  Dispensed plantar fascial brace and a night splint.  Start her on a Medrol Dosepak to be followed by meloxicam.  May need to consider orthotics  next visit.     Max T. [place], [place]

## 2018-09-22 DIAGNOSIS — Z0100 Encounter for examination of eyes and vision without abnormal findings: Secondary | ICD-10-CM | POA: Diagnosis not present

## 2018-09-22 DIAGNOSIS — H524 Presbyopia: Secondary | ICD-10-CM | POA: Diagnosis not present

## 2018-10-13 ENCOUNTER — Ambulatory Visit (INDEPENDENT_AMBULATORY_CARE_PROVIDER_SITE_OTHER): Payer: 59 | Admitting: Podiatry

## 2018-10-13 ENCOUNTER — Encounter: Payer: Self-pay | Admitting: Podiatry

## 2018-10-13 DIAGNOSIS — M722 Plantar fascial fibromatosis: Secondary | ICD-10-CM

## 2018-10-13 NOTE — Progress Notes (Signed)
Presents today for follow-up of plantar fasciitis of right foot states that is still a little tender it still hurts when I stand on it assessment 40% better continue to wear the night splint today splint he continue to take the medication.  Objective: Vital signs are stable alert and oriented x3.  Pulses are palpable.  Pain on palpation medial tubercle of the right heel.  No open lesions or wounds.  Assessment: Plantar fasciitis resolving 40%.  Plan: Continue all conservative therapies injected 20 mg Kenalog 5 mg Marcaine point maximal tenderness medial aspect right heel.  Follow-up with her in 1 month consider orthotics at that time.

## 2018-10-20 DIAGNOSIS — Z23 Encounter for immunization: Secondary | ICD-10-CM | POA: Diagnosis not present

## 2018-10-20 DIAGNOSIS — Z1231 Encounter for screening mammogram for malignant neoplasm of breast: Secondary | ICD-10-CM | POA: Diagnosis not present

## 2018-10-20 DIAGNOSIS — I10 Essential (primary) hypertension: Secondary | ICD-10-CM | POA: Diagnosis not present

## 2018-10-20 DIAGNOSIS — Z0000 Encounter for general adult medical examination without abnormal findings: Secondary | ICD-10-CM | POA: Diagnosis not present

## 2018-10-21 DIAGNOSIS — Z1231 Encounter for screening mammogram for malignant neoplasm of breast: Secondary | ICD-10-CM | POA: Diagnosis not present

## 2018-10-21 DIAGNOSIS — N390 Urinary tract infection, site not specified: Secondary | ICD-10-CM | POA: Diagnosis not present

## 2018-11-03 DIAGNOSIS — N390 Urinary tract infection, site not specified: Secondary | ICD-10-CM | POA: Diagnosis not present

## 2018-11-03 DIAGNOSIS — I10 Essential (primary) hypertension: Secondary | ICD-10-CM | POA: Diagnosis not present

## 2018-11-17 ENCOUNTER — Ambulatory Visit: Payer: 59 | Admitting: Podiatry

## 2018-12-01 DIAGNOSIS — I10 Essential (primary) hypertension: Secondary | ICD-10-CM | POA: Diagnosis not present

## 2018-12-01 DIAGNOSIS — R399 Unspecified symptoms and signs involving the genitourinary system: Secondary | ICD-10-CM | POA: Diagnosis not present

## 2019-01-05 DIAGNOSIS — R399 Unspecified symptoms and signs involving the genitourinary system: Secondary | ICD-10-CM | POA: Diagnosis not present

## 2019-01-05 DIAGNOSIS — I10 Essential (primary) hypertension: Secondary | ICD-10-CM | POA: Diagnosis not present

## 2019-11-03 ENCOUNTER — Ambulatory Visit
Admission: EM | Admit: 2019-11-03 | Discharge: 2019-11-03 | Disposition: A | Discharge status: Home / Self Care | Payer: Managed Care, Other (non HMO) | Attending: Family Medicine | Admitting: Family Medicine

## 2019-11-03 ENCOUNTER — Encounter: Payer: Self-pay | Admitting: Emergency Medicine

## 2019-11-03 ENCOUNTER — Other Ambulatory Visit: Payer: Self-pay

## 2019-11-03 DIAGNOSIS — R350 Frequency of micturition: Secondary | ICD-10-CM

## 2019-11-03 DIAGNOSIS — M545 Low back pain, unspecified: Secondary | ICD-10-CM

## 2019-11-03 HISTORY — DX: Other seasonal allergic rhinitis: J30.2

## 2019-11-03 HISTORY — DX: Essential (primary) hypertension: I10

## 2019-11-03 LAB — URINALYSIS, COMPLETE (UACMP) WITH MICROSCOPIC
Bilirubin Urine: NEGATIVE
Glucose, UA: NEGATIVE mg/dL
Ketones, ur: NEGATIVE mg/dL
Leukocytes,Ua: NEGATIVE
Nitrite: NEGATIVE
Protein, ur: NEGATIVE mg/dL
Specific Gravity, Urine: 1.025 (ref 1.005–1.030)
WBC, UA: NONE SEEN WBC/hpf (ref 0–5)
pH: 6.5 (ref 5.0–8.0)

## 2019-11-03 NOTE — ED Triage Notes (Signed)
Patient in today c/o urinary frequency and Ewings back pain x 3 days.

## 2019-11-03 NOTE — Discharge Instructions (Signed)
Awaiting culture.  Naproxen as needed.  Take care  Dr. Lacinda Axon

## 2019-11-03 NOTE — ED Provider Notes (Signed)
[facility]    CSN: [id number] Arrival date & time: 11/03/19  1734  History   Chief Complaint Chief Complaint  Patient presents with  . Urinary Frequency  . Back Pain   HPI  48 year old female presents with the above complaints.  Patient reports that she has had [place] back pain for the past 3 days.  She is also noted some urinary frequency and urgency.  No fever.  No abdominal pain.  No flank pain.  She reports that she has a history of UTI.  She is concerned that she has UTI.  Denies dysuria.  No known exacerbating relieving factors.  No medications or interventions tried.  No other complaints.  PMH, Surgical Hx, Family Hx, Social History reviewed and updated as below.  Past Medical History:  Diagnosis Date  . GERD (gastroesophageal reflux disease)   . Hypertension   . Patient denies medical problems   . Seasonal allergies   . UTI (lower urinary tract infection)     Patient Active Problem List   Diagnosis Date Noted  . Allergy 09/15/2018  . Migraine headache 09/15/2018  . Recurrent urinary tract infection 09/15/2018  . Encounter for routine gynecological examination 03/13/2014  . Positive H. pylori test 06/19/2013  . Microscopic hematuria 06/09/2012    Past Surgical History:  Procedure Laterality Date  . KNEE SURGERY Left   . MOUTH SURGERY      OB History    Gravida  1   Para  0   Term      Preterm      AB  1   Living        SAB      TAB      Ectopic      Multiple      Live Births               Home Medications    Prior to Admission medications   Medication Sig Start Date End Date Taking? Authorizing Provider  famotidine (PEPCID) 40 MG tablet Take 40 mg by mouth daily.   Yes [provider]  levocetirizine (XYZAL) 5 MG tablet Take 5 mg by mouth every evening.   Yes [provider]  levonorgestrel-ethinyl estradiol (SEASONALE,INTROVALE,JOLESSA) 0.15-0.03 MG tablet Take 1 tablet by mouth daily.   Yes  [provider]  losartan (COZAAR) 100 MG tablet Take 100 mg by mouth daily. 09/09/19  Yes [provider]  cetirizine-pseudoephedrine (ZYRTEC-D) 5-120 MG tablet Take 1 tablet by mouth 2 (two) times daily. 09/11/18   Lorin Picket, PA-C  meloxicam (MOBIC) 15 MG tablet Take 1 tablet (15 mg total) by mouth daily. 09/15/18   Hyatt, Romilda Garret, DPM    Family History Family History  Problem Relation Age of Onset  . Irritable bowel syndrome Mother   . Nephrolithiasis Mother   . Dementia Mother   . Alzheimer's disease Mother   . Heart attack Father     Social History Social History   Tobacco Use  . Smoking status: Former Smoker    Quit date: 11/04/1985    Years since quitting: 34.0  . Smokeless tobacco: Never Used  Substance Use Topics  . Alcohol use: Yes    Alcohol/week: 3.0 standard drinks    Types: 3 Glasses of wine per week  . Drug use: No   Allergies   Penicillins and Shellfish allergy   Review of Systems Review of Systems  Constitutional: Negative.   Genitourinary: Positive for frequency and urgency.  Musculoskeletal: Positive for back pain.   Physical Exam Triage Vital Signs ED Triage Vitals  Enc Vitals Group     BP 11/03/19 1746 125/82     Pulse Rate 11/03/19 1746 79     Resp 11/03/19 1746 18     Temp 11/03/19 1746 98.2 F (36.8 C)     Temp Source 11/03/19 1746 Oral     SpO2 11/03/19 1746 100 %     Weight 11/03/19 1747 158 lb (71.7 kg)     Height 11/03/19 1747 5\' 4"  (1.626 m)     Head Circumference --      Peak Flow --      Pain Score 11/03/19 1746 7     Pain Loc --      Pain Edu? --      Excl. in GC? --    Updated Vital Signs BP 125/82 (BP Location: Left Arm)   Pulse 79   Temp 98.2 F (36.8 C) (Oral)   Resp 18   Ht 5\' 4"  (1.626 m)   Wt 71.7 kg   LMP 07/07/2019 Comment: 3 month continuous OCP  SpO2 100%   BMI 27.12 kg/m   Visual Acuity Right Eye Distance:   Left Eye Distance:   Bilateral Distance:    Right Eye Near:   Left  Eye Near:    Bilateral Near:     Physical Exam Vitals and nursing note reviewed.  Constitutional:      General: She is not in acute distress.    Appearance: Normal appearance. She is not ill-appearing.  HENT:     Head: Normocephalic and atraumatic.  Eyes:     General:        Right eye: No discharge.        Left eye: No discharge.     Conjunctiva/sclera: Conjunctivae normal.  Cardiovascular:     Rate and Rhythm: Normal rate and regular rhythm.     Heart sounds: No murmur.  Pulmonary:     Effort: Pulmonary effort is normal.     Breath sounds: Normal breath sounds. No wheezing, rhonchi or rales.  Abdominal:     General: There is no distension.     Palpations: Abdomen is soft.     Tenderness: There is no abdominal tenderness.  Neurological:     Mental Status: She is alert.  Psychiatric:        Mood and Affect: Mood normal.        Behavior: Behavior normal.    UC Treatments / Results  Labs (all labs ordered are listed, but only abnormal results are displayed) Labs Reviewed  URINALYSIS, COMPLETE (UACMP) WITH MICROSCOPIC - Abnormal; Notable for the following components:      Result Value   APPearance HAZY (*)    Hgb urine dipstick SMALL (*)    Bacteria, UA MANY (*)    All other components within normal limits  URINE CULTURE    EKG   Radiology No results found.  Procedures Procedures (including critical care time)  Medications Ordered in UC Medications - No data to display  Initial Impression / Assessment and Plan / UC Course  I have reviewed the triage vital signs and the nursing notes.  Pertinent labs & imaging results that were available during my care of the patient were reviewed by me and considered in my medical decision making (see chart for details).    48 year old female presents with Adriance back pain.  No evidence of UTI.  Sending culture.  Naproxen  twice daily as needed for pain.  Advised to take 2 over-the-counter naproxen's twice daily as needed.   Final Clinical Impressions(s) / UC Diagnoses   Final diagnoses:  Acute bilateral [place] back pain without sciatica     Discharge Instructions     Awaiting culture.  Naproxen as needed.  Take care  Dr. Adriana Simas    ED Prescriptions    None     PDMP not reviewed this encounter.   Tommie Sams, [place] 11/03/19 1916

## 2019-11-04 LAB — URINE CULTURE: Culture: <10000 — AB

## 2019-11-20 ENCOUNTER — Other Ambulatory Visit: Payer: Self-pay

## 2019-11-20 ENCOUNTER — Ambulatory Visit
Admission: EM | Admit: 2019-11-20 | Discharge: 2019-11-20 | Disposition: A | Discharge status: Home / Self Care | Payer: Managed Care, Other (non HMO) | Attending: Family Medicine | Admitting: Family Medicine

## 2019-11-20 ENCOUNTER — Encounter: Payer: Self-pay | Admitting: Emergency Medicine

## 2019-11-20 DIAGNOSIS — Z20822 Contact with and (suspected) exposure to covid-19: Secondary | ICD-10-CM | POA: Diagnosis present

## 2019-11-20 DIAGNOSIS — J349 Unspecified disorder of nose and nasal sinuses: Secondary | ICD-10-CM

## 2019-11-20 DIAGNOSIS — J019 Acute sinusitis, unspecified: Secondary | ICD-10-CM

## 2019-11-20 DIAGNOSIS — H9203 Otalgia, bilateral: Secondary | ICD-10-CM | POA: Diagnosis not present

## 2019-11-20 DIAGNOSIS — Z87891 Personal history of nicotine dependence: Secondary | ICD-10-CM | POA: Diagnosis not present

## 2019-11-20 DIAGNOSIS — R519 Headache, unspecified: Secondary | ICD-10-CM | POA: Diagnosis not present

## 2019-11-20 MED ORDER — AMOXICILLIN-POT CLAVULANATE 875-125 MG PO TABS: 1.0000 | ORAL_TABLET | Freq: Two times a day (BID) | ORAL | 0 refills | Status: AC

## 2019-11-20 MED ORDER — FLUCONAZOLE 150 MG PO TABS: ORAL_TABLET | ORAL | 0 refills | Status: DC

## 2019-11-20 NOTE — ED Triage Notes (Signed)
Patient c/o sinus congestion and pressure, nasal congestion, headache, and ear pain that started a week ago.  Patient denies fevers.

## 2019-11-20 NOTE — Discharge Instructions (Addendum)
It was very nice seeing you today in clinic. Thank you for entrusting me with your care.   Rest and stay HYDRATED. Water and electrolyte containing beverages (Gatorade, Pedialyte) are best to prevent dehydration and electrolyte abnormalities.  May use Tylenol and/or Ibuprofen as needed for pain/fever.   Please utilize the medications that we discussed. Your prescriptions has been called in to your pharmacy.   You were tested for SARS-CoV-2 (novel coronavirus) today. Testing is performed by an outside lab ([facility]) and has variable turn around times ranging between 2-5 days. Current recommendations from the the CDC and [place] DHHS require that you remain out of work in order to quarantine at home until negative test results are have been received. In the event that your test results are positive, you will be contacted with further directives. These measures are being implemented out of an abundance of caution to prevent transmission and spread during the current SARS-CoV-2 pandemic.  Make arrangements to follow up with your regular doctor in 1 week for re-evaluation if not improving. If your symptoms/condition worsens, please seek follow up care either here or in the ER. Please remember, our [facility] providers are "right here with you" when you need us.   Again, it was my pleasure to take care of you today. Thank you for choosing our clinic. I hope that you start to feel better quickly.   Tamaiya Bump, MSN, APRN, FNP-C, CEN Advanced Practice Provider [facility]  

## 2019-11-21 LAB — NOVEL CORONAVIRUS, NAA (HOSP ORDER, SEND-OUT TO REF LAB; TAT 18-24 HRS): SARS-CoV-2, NAA: NOT DETECTED

## 2019-11-21 NOTE — ED Provider Notes (Signed)
[place], [place]   Name: Alyssa Valencia DOB: 1970-12-20 MRN: [id number] CSN: [id number] PCP: [facility]  Arrival date and time:  11/20/19 1504  Chief Complaint:  Sinus Problem   NOTE: Prior to seeing the patient today, I have reviewed the triage nursing documentation and vital signs. Clinical staff has updated patient's PMH/PSHx, current medication list, and drug allergies/intolerances to ensure comprehensive history available to assist in medical decision making.   History:   HPI: Alyssa Valencia is a 49 y.o. female who presents today with complaints of congestion, paranasal sinus tenderness, BILATERAL otalgia, and generalized headaches that started approximately 7-10 days ago. Prior to that, patient notes that her allergies have been bothering her for 2 weeks. All in all, symptoms worsening over about 3 weeks. Patient denies fevers. She denies any cough, shortness of breath, or wheezing. She denies that she has experienced any nausea, vomiting, diarrhea, or abdominal pain. She is eating and drinking well. Patient denies any perceived alterations to her sense of taste or smell. Patient denies being in close contact with anyone known to be ill; no one else is her home has experienced a similar symptom constellation. She has never been tested for SARS-CoV-2 (novel coronavirus) in the past per her report. Patient has been vaccinated for influenza this season. In efforts to conservatively manage her symptoms at home, the patient notes that she has used Sudafed, Tylenol cold/sinus, and decongestants, which have not significantly helped to improve her symptoms.    Past Medical History:  Diagnosis Date  . GERD (gastroesophageal reflux disease)   . Hypertension   . Patient denies medical problems   . Seasonal allergies   . UTI (lower urinary tract infection)     Past Surgical History:  Procedure Laterality Date  . KNEE SURGERY Left   . MOUTH SURGERY      Family History  Problem  Relation Age of Onset  . Irritable bowel syndrome Mother   . Nephrolithiasis Mother   . Dementia Mother   . Alzheimer's disease Mother   . Heart attack Father     Social History   Tobacco Use  . Smoking status: Former Smoker    Quit date: 11/04/1985    Years since quitting: 34.0  . Smokeless tobacco: Never Used  Substance Use Topics  . Alcohol use: Yes    Alcohol/week: 3.0 standard drinks    Types: 3 Glasses of wine per week  . Drug use: No    Patient Active Problem List   Diagnosis Date Noted  . Allergy 09/15/2018  . Migraine headache 09/15/2018  . Recurrent urinary tract infection 09/15/2018  . Encounter for routine gynecological examination 03/13/2014  . Positive H. pylori test 06/19/2013  . Microscopic hematuria 06/09/2012    Home Medications:    Current Meds  Medication Sig  . famotidine (PEPCID) 40 MG tablet Take 40 mg by mouth daily.  Marland Kitchen levocetirizine (XYZAL) 5 MG tablet Take 5 mg by mouth every evening.  Marland Kitchen levonorgestrel-ethinyl estradiol (SEASONALE,INTROVALE,JOLESSA) 0.15-0.03 MG tablet Take 1 tablet by mouth daily.  Marland Kitchen losartan (COZAAR) 100 MG tablet Take 100 mg by mouth daily.    Allergies:   Penicillins and Shellfish allergy  Review of Systems (ROS): Review of Systems  Constitutional: Negative for fatigue and fever.  HENT: Positive for congestion, ear pain, sinus pressure and sinus pain. Negative for postnasal drip, rhinorrhea, sneezing and sore throat.   Eyes: Negative for pain, discharge and redness.  Respiratory: Negative for cough, chest tightness and shortness  of breath.   Cardiovascular: Negative for chest pain and palpitations.  Gastrointestinal: Negative for abdominal pain, diarrhea, nausea and vomiting.  Musculoskeletal: Negative for arthralgias, back pain, myalgias and neck pain.  Skin: Negative for color change, pallor and rash.  Allergic/Immunologic: Positive for environmental allergies.  Neurological: Positive for headaches. Negative for  dizziness, syncope and weakness.  Hematological: Negative for adenopathy.     Vital Signs: Today's Vitals   11/20/19 1517 11/20/19 1520 11/20/19 1558  BP:  (!) 133/99   Pulse:  65   Resp:  14   Temp:  98.3 F (36.8 C)   TempSrc:  Oral   SpO2:  100%   Weight: 152 lb (68.9 kg)    Height: 5\' 4"  (1.626 m)    PainSc: 7   7     Physical Exam: Physical Exam  Constitutional: She is oriented to person, place, and time and well-developed, well-nourished, and in no distress.  HENT:  Head: Normocephalic and atraumatic.  Right Ear: There is tenderness. Tympanic membrane is erythematous. Tympanic membrane is not bulging. A middle ear effusion (serous) is present.  Left Ear: There is tenderness. Tympanic membrane is erythematous. Tympanic membrane is not bulging. A middle ear effusion (serous) is present.  Nose: Mucosal edema, rhinorrhea and sinus tenderness present.  Mouth/Throat: Uvula is midline and mucous membranes are normal. Posterior oropharyngeal erythema present. No oropharyngeal exudate or posterior oropharyngeal edema.  Eyes: Pupils are equal, round, and reactive to light.  Cardiovascular: Normal rate, regular rhythm, normal heart sounds and intact distal pulses.  Pulmonary/Chest: Effort normal and breath sounds normal.  Lymphadenopathy:       Head (right side): Submandibular adenopathy present.       Head (left side): Submandibular adenopathy present.  Neurological: She is alert and oriented to person, place, and time. Gait normal.  Skin: Skin is warm and dry. No rash noted. She is not diaphoretic.  Psychiatric: Mood, memory, affect and judgment normal.  Nursing note and vitals reviewed.   Urgent Care Treatments / Results:   Orders Placed This Encounter  Procedures  . Novel Coronavirus, NAA (Hosp order, Send-out to Ref Lab; TAT 18-24 hrs    LABS: PLEASE NOTE: all labs that were ordered this encounter are listed, however only abnormal results are displayed. Labs Reviewed    NOVEL CORONAVIRUS, NAA (HOSP ORDER, SEND-OUT TO REF LAB; TAT 18-24 HRS)    EKG: -None  RADIOLOGY: No results found.  PROCEDURES: Procedures  MEDICATIONS RECEIVED THIS VISIT: Medications - No data to display  PERTINENT CLINICAL COURSE NOTES/UPDATES:   Initial Impression / Assessment and Plan / Urgent Care Course:  Pertinent labs & imaging results that were available during my care of the patient were personally reviewed by me and considered in my medical decision making (see lab/imaging section of note for values and interpretations).  Keirah Krueger is a 49 y.o. female who presents to [facility] today with complaints of Sinus Problem  Patient overall well appearing and in no acute distress today in clinic. Presenting symptoms (see HPI) and exam as documented above. She presents with symptoms associated with SARS-CoV-2 (novel coronavirus). Discussed typical symptom constellation. Reviewed potential for infection and need for testing. Patient amenable to being tested. SARS-CoV-2 swab collected by certified clinical staff. Discussed variable turn around times associated with testing, as swabs are being processed at [facility], and have been taking between 24-48 hours to come back. She was advised to self quarantine, per [facility] DHHS guidelines, until negative results received. These  measures are being implemented out of an abundance of caution to prevent transmission and spread during the current SARS-CoV-2 pandemic.  Presenting symptoms consistent with acute sinusitis. Until ruled out with confirmatory lab testing, SARS-CoV-2 remains part of the differential. Her testing is pending at this time. Patient has failed outpatient treatment with various OTC interventions; symptoms progressive. Will proceed with treatment using a 7 day course of amoxicillin-clavulanate. Discussed supportive care measures at home during acute phase of illness. Patient to rest as much as possible. She was encouraged to  ensure adequate hydration (water and ORS) to prevent dehydration and electrolyte derangements. Patient may use APAP and/or IBU on an as needed basis for pain/fever. May add fluticasone to help with nasal congestion and sinus tenderness. Patient has has a history of vulvovaginal candidiasis in the past while on oral antimicrobial therapy. Will send in prophylactic fluconazole dose (150 mg x 1 - may repeat in 72 hours if still symptomatic) for patient to use should she develop symptoms.  Discussed follow up with primary care physician in 1 week for re-evaluation. I have reviewed the follow up and strict return precautions for any new or worsening symptoms. Patient is aware of symptoms that would be deemed urgent/emergent, and would thus require further evaluation either here or in the emergency department. At the time of discharge, she verbalized understanding and consent with the discharge plan as it was reviewed with her. All questions were fielded by provider and/or clinic staff prior to patient discharge.    Final Clinical Impressions / Urgent Care Diagnoses:   Final diagnoses:  Acute non-recurrent sinusitis, unspecified location  Encounter for laboratory testing for COVID-19 virus    New Prescriptions:  [place] Controlled Substance Registry consulted? Not Applicable  Meds ordered this encounter  Medications  . amoxicillin-clavulanate (AUGMENTIN) 875-125 MG tablet    Sig: Take 1 tablet by mouth 2 (two) times daily for 7 days.    Dispense:  14 tablet    Refill:  0  . fluconazole (DIFLUCAN) 150 MG tablet    Sig: Take 1 tablet (150 mg) PO x 1 dose. May repeat 150 mg dose in 3 days if still symptomatic.    Dispense:  2 tablet    Refill:  0    Recommended Follow up Care:  Patient encouraged to follow up with the following provider within the specified time frame, or sooner as dictated by the severity of her symptoms. As always, she was instructed that for any urgent/emergent care needs, she should  seek care either here or in the emergency department for more immediate evaluation.  Follow-up Information    [facility] In 1 week.   Specialty: Family Medicine Why: General reassessment of symptoms if not improving Contact information: [address] [place] [address] [phone]         NOTE: This note was prepared using Dragon dictation software along with smaller phrase technology. Despite my best ability to proofread, there is the potential that transcriptional errors may still occur from this process, and are completely unintentional.    Verlee Monte, NP 11/21/19 2118

## 2020-04-14 ENCOUNTER — Encounter: Payer: Self-pay | Admitting: Emergency Medicine

## 2020-04-14 ENCOUNTER — Other Ambulatory Visit: Payer: Self-pay

## 2020-04-14 ENCOUNTER — Ambulatory Visit
Admission: EM | Admit: 2020-04-14 | Discharge: 2020-04-14 | Disposition: A | Discharge status: Home / Self Care | Payer: 59 | Attending: Family Medicine | Admitting: Family Medicine

## 2020-04-14 DIAGNOSIS — J0191 Acute recurrent sinusitis, unspecified: Secondary | ICD-10-CM | POA: Insufficient documentation

## 2020-04-14 LAB — URINALYSIS, COMPLETE (UACMP) WITH MICROSCOPIC
Bilirubin Urine: NEGATIVE
Glucose, UA: NEGATIVE mg/dL
Ketones, ur: NEGATIVE mg/dL
Leukocytes,Ua: NEGATIVE
Nitrite: NEGATIVE
Protein, ur: NEGATIVE mg/dL
Specific Gravity, Urine: 1.025 (ref 1.005–1.030)
WBC, UA: NONE SEEN WBC/hpf (ref 0–5)
pH: 5.5 (ref 5.0–8.0)

## 2020-04-14 MED ORDER — DOXYCYCLINE HYCLATE 100 MG PO CAPS: 100.0000 mg | ORAL_CAPSULE | Freq: Two times a day (BID) | ORAL | 0 refills | Status: DC

## 2020-04-14 NOTE — ED Triage Notes (Signed)
Patient in today c/o sinus pain/pressure and bilateral ear pain L>R x 4 days. Patient denies fever. Patient states she is using Flonase and Allegra.  Patient also c/o urinary frequency, urgency and burning x 1 day. Patient has not used any OTC medications, but is drinking cranberry juice.

## 2020-04-14 NOTE — ED Provider Notes (Signed)
[facility]    CSN: [id number] Arrival date & time: 04/14/20  1738  History   Chief Complaint Chief Complaint  Patient presents with  . Sinus Problem  . Otalgia  . Urinary Frequency  . Dysuria   HPI  49 year old female presents with the above complaints.  Patient has a history of recurrent sinusitis.  She reports sinus pain and pressure as well as bilateral ear pain for the past 4 days.  She states that she is using Flonase and Allegra and has had no improvement.  She believes that she is developing sinusitis.  Additionally, patient reports a 1 day history of urinary frequency, urgency, dysuria.  Has a history of recurrent UTI.  No medications taken.  She is drinking cranberry juice.  No other associated symptoms.  No other complaints concerns at this time.  Past Medical History:  Diagnosis Date  . GERD (gastroesophageal reflux disease)   . Hypertension   . Patient denies medical problems   . Seasonal allergies   . UTI (lower urinary tract infection)    Patient Active Problem List   Diagnosis Date Noted  . Allergy 09/15/2018  . Migraine headache 09/15/2018  . Recurrent urinary tract infection 09/15/2018  . Encounter for routine gynecological examination 03/13/2014  . Positive H. pylori test 06/19/2013  . Microscopic hematuria 06/09/2012    Past Surgical History:  Procedure Laterality Date  . KNEE SURGERY Left   . MOUTH SURGERY      OB History    Gravida  1   Para  0   Term      Preterm      AB  1   Living        SAB      TAB      Ectopic      Multiple      Live Births               Home Medications    Prior to Admission medications   Medication Sig Start Date End Date Taking? Authorizing Provider  famotidine (PEPCID) 40 MG tablet Take 40 mg by mouth daily.   Yes [provider]  fexofenadine (ALLEGRA) 180 MG tablet Take 180 mg by mouth daily.   Yes [provider]  fluticasone (FLONASE) 50 MCG/ACT nasal  spray Place 2 sprays into both nostrils daily. 01/01/20  Yes [provider]  levonorgestrel-ethinyl estradiol (SEASONALE,INTROVALE,JOLESSA) 0.15-0.03 MG tablet Take 1 tablet by mouth daily.   Yes [provider]  losartan (COZAAR) 100 MG tablet Take 100 mg by mouth daily. 09/09/19  Yes [provider]  doxycycline (VIBRAMYCIN) 100 MG capsule Take 1 capsule (100 mg total) by mouth 2 (two) times daily. 04/14/20   Coral Spikes, DO    Family History Family History  Problem Relation Age of Onset  . Irritable bowel syndrome Mother   . Nephrolithiasis Mother   . Dementia Mother   . Alzheimer's disease Mother   . Heart attack Father     Social History Social History   Tobacco Use  . Smoking status: Former Smoker    Quit date: 11/04/1985    Years since quitting: 34.4  . Smokeless tobacco: Never Used  Vaping Use  . Vaping Use: Never used  Substance Use Topics  . Alcohol use: Yes    Alcohol/week: 3.0 standard drinks    Types: 3 Glasses of wine per week  . Drug use: No     Allergies   Penicillins, Amoxicillin,  and Shellfish allergy   Review of Systems Review of Systems  HENT: Positive for congestion, ear pain, sinus pressure and sinus pain.   Genitourinary: Positive for dysuria, frequency and urgency.   Physical Exam Triage Vital Signs ED Triage Vitals  Enc Vitals Group     BP 04/14/20 1751 119/83     Pulse Rate 04/14/20 1751 84     Resp 04/14/20 1751 18     Temp 04/14/20 1751 98.3 F (36.8 C)     Temp Source 04/14/20 1751 Oral     SpO2 04/14/20 1751 100 %     Weight 04/14/20 1752 149 lb (67.6 kg)     Height 04/14/20 1752 5\' 4"  (1.626 m)     Head Circumference --      Peak Flow --      Pain Score 04/14/20 1751 7     Pain Loc --      Pain Edu? --      Excl. in GC? --    Updated Vital Signs BP 119/83 (BP Location: Left Arm)   Pulse 84   Temp 98.3 F (36.8 C) (Oral)   Resp 18   Ht 5\' 4"  (1.626 m)   Wt 67.6 kg   LMP 02/01/2020  (Approximate) Comment: 3 month continuous OCP  SpO2 100%   BMI 25.58 kg/m   Visual Acuity Right Eye Distance:   Left Eye Distance:   Bilateral Distance:    Right Eye Near:   Left Eye Near:    Bilateral Near:     Physical Exam Vitals and nursing note reviewed.  Constitutional:      General: She is not in acute distress.    Appearance: Normal appearance. She is not ill-appearing.  HENT:     Head: Normocephalic and atraumatic.     Nose:     Right Sinus: Maxillary sinus tenderness present.     Left Sinus: Maxillary sinus tenderness present.  Eyes:     General:        Right eye: No discharge.        Left eye: No discharge.     Conjunctiva/sclera: Conjunctivae normal.  Cardiovascular:     Rate and Rhythm: Normal rate and regular rhythm.     Heart sounds: No murmur heard.   Pulmonary:     Effort: Pulmonary effort is normal.     Breath sounds: Normal breath sounds. No wheezing, rhonchi or rales.  Neurological:     Mental Status: She is alert.  Psychiatric:        Mood and Affect: Mood normal.        Behavior: Behavior normal.    UC Treatments / Results  Labs (all labs ordered are listed, but only abnormal results are displayed) Labs Reviewed  URINALYSIS, COMPLETE (UACMP) WITH MICROSCOPIC - Abnormal; Notable for the following components:      Result Value   Hgb urine dipstick MODERATE (*)    Bacteria, UA MANY (*)    All other components within normal limits  URINE CULTURE    EKG   Radiology No results found.  Procedures Procedures (including critical care time)  Medications Ordered in UC Medications - No data to display  Initial Impression / Assessment and Plan / UC Course  I have reviewed the triage vital signs and the nursing notes.  Pertinent labs & imaging results that were available during my care of the patient were reviewed by me and considered in my medical decision making (see chart for details).  49 year old female presents with sinusitis.  Also has urinary symptoms.  No pyuria was noted.  Awaiting culture.  Patient appears to have sinusitis.  Treating with doxycycline given penicillin allergy.  Final Clinical Impressions(s) / UC Diagnoses   Final diagnoses:  Acute recurrent sinusitis, unspecified location   Discharge Instructions   None    ED Prescriptions    Medication Sig Dispense Auth. Provider   doxycycline (VIBRAMYCIN) 100 MG capsule Take 1 capsule (100 mg total) by mouth 2 (two) times daily. 20 capsule Tommie Sams, DO     PDMP not reviewed this encounter.   Tommie Sams, [place] 04/14/20 1949

## 2020-04-16 LAB — URINE CULTURE: Culture: <10000 — AB

## 2020-04-27 ENCOUNTER — Telehealth: Payer: Self-pay | Admitting: Family

## 2020-04-27 MED ORDER — FLUCONAZOLE 150 MG PO TABS: 150.0000 mg | ORAL_TABLET | Freq: Once | ORAL | 0 refills | Status: AC

## 2020-04-27 NOTE — Telephone Encounter (Signed)
Patient called in requesting Diflucan to be sent in to [facility] [place]. She was placed on an antibiotic last week by Dr. Adriana Simas and she now has a yeast infection. Spoke with Randall Hiss, NP. Will send in script for Diflucan to [facility] [place]. Patient notified.

## 2020-07-04 DIAGNOSIS — Z23 Encounter for immunization: Secondary | ICD-10-CM | POA: Diagnosis not present

## 2020-07-04 DIAGNOSIS — I10 Essential (primary) hypertension: Secondary | ICD-10-CM | POA: Diagnosis not present

## 2020-07-04 DIAGNOSIS — Z0000 Encounter for general adult medical examination without abnormal findings: Secondary | ICD-10-CM | POA: Diagnosis not present

## 2020-07-04 DIAGNOSIS — Z1231 Encounter for screening mammogram for malignant neoplasm of breast: Secondary | ICD-10-CM | POA: Diagnosis not present

## 2020-07-26 ENCOUNTER — Other Ambulatory Visit: Payer: Self-pay

## 2020-07-26 ENCOUNTER — Ambulatory Visit
Admission: EM | Admit: 2020-07-26 | Discharge: 2020-07-26 | Disposition: A | Discharge status: Home / Self Care | Payer: 59 | Attending: Emergency Medicine | Admitting: Emergency Medicine

## 2020-07-26 DIAGNOSIS — G43909 Migraine, unspecified, not intractable, without status migrainosus: Secondary | ICD-10-CM | POA: Diagnosis not present

## 2020-07-26 DIAGNOSIS — Z793 Long term (current) use of hormonal contraceptives: Secondary | ICD-10-CM | POA: Diagnosis not present

## 2020-07-26 DIAGNOSIS — R05 Cough: Secondary | ICD-10-CM | POA: Diagnosis not present

## 2020-07-26 DIAGNOSIS — Z79899 Other long term (current) drug therapy: Secondary | ICD-10-CM | POA: Insufficient documentation

## 2020-07-26 DIAGNOSIS — R197 Diarrhea, unspecified: Secondary | ICD-10-CM | POA: Diagnosis not present

## 2020-07-26 DIAGNOSIS — I10 Essential (primary) hypertension: Secondary | ICD-10-CM | POA: Insufficient documentation

## 2020-07-26 DIAGNOSIS — R0602 Shortness of breath: Secondary | ICD-10-CM | POA: Diagnosis not present

## 2020-07-26 DIAGNOSIS — K219 Gastro-esophageal reflux disease without esophagitis: Secondary | ICD-10-CM | POA: Insufficient documentation

## 2020-07-26 DIAGNOSIS — U071 COVID-19: Secondary | ICD-10-CM | POA: Insufficient documentation

## 2020-07-26 DIAGNOSIS — R0981 Nasal congestion: Secondary | ICD-10-CM | POA: Insufficient documentation

## 2020-07-26 DIAGNOSIS — R062 Wheezing: Secondary | ICD-10-CM | POA: Diagnosis not present

## 2020-07-26 DIAGNOSIS — Z87891 Personal history of nicotine dependence: Secondary | ICD-10-CM | POA: Insufficient documentation

## 2020-07-26 MED ORDER — BENZONATATE 100 MG PO CAPS: 200.0000 mg | ORAL_CAPSULE | Freq: Three times a day (TID) | ORAL | 0 refills | Status: DC | PRN

## 2020-07-26 NOTE — ED Triage Notes (Signed)
Patient in today for cough, H/A, sinus congestion and drainage, chest congestion. Patient denies loss of taste/smell, fever.   Patient is vaccinated against COVID-19.

## 2020-07-26 NOTE — ED Provider Notes (Signed)
[facility]    CSN: [id number] Arrival date & time: 07/26/20  1816      History   Chief Complaint Chief Complaint  Patient presents with  . Cough  . Headache  . Diarrhea  . Nasal Congestion    HPI Alyssa Valencia is a 49 y.o. female.   49 yo female here for evaluation of chest tightness and cough after 2 weeks of a sinus infection.  She was treated by her PCP with Amoxil for the sinus infection and was recently Rx'd an Albuterol inhaler to help with the chest tightness and wheezing which it has.   She denies fever, changes to sense of taste or smell. She does have SOB at times, but describe sit more as tightness and as being worse at night. Her cough is non-productive. She has been using Nyquil and Delsym for her cough and this ha caused her BP to be elevated.        Past Medical History:  Diagnosis Date  . GERD (gastroesophageal reflux disease)   . Hypertension   . Patient denies medical problems   . Seasonal allergies   . UTI (lower urinary tract infection)     Patient Active Problem List   Diagnosis Date Noted  . Allergy 09/15/2018  . Migraine headache 09/15/2018  . Recurrent urinary tract infection 09/15/2018  . Encounter for routine gynecological examination 03/13/2014  . Positive H. pylori test 06/19/2013  . Microscopic hematuria 06/09/2012    Past Surgical History:  Procedure Laterality Date  . KNEE SURGERY Left   . MOUTH SURGERY      OB History    Gravida  1   Para  0   Term      Preterm      AB  1   Living        SAB      TAB      Ectopic      Multiple      Live Births               Home Medications    Prior to Admission medications   Medication Sig Start Date End Date Taking? Authorizing Provider  albuterol (VENTOLIN HFA) 108 (90 Base) MCG/ACT inhaler SMARTSIG:2 Inhalation Via Inhaler Every 4 Hours PRN 07/18/20  Yes [provider]  famotidine (PEPCID) 40 MG tablet Take 40 mg by mouth daily.   Yes  [provider]  fexofenadine (ALLEGRA) 180 MG tablet Take 180 mg by mouth daily.   Yes [provider]  fluticasone (FLONASE) 50 MCG/ACT nasal spray Place 2 sprays into both nostrils daily. 01/01/20  Yes [provider]  levonorgestrel-ethinyl estradiol (SEASONALE,INTROVALE,JOLESSA) 0.15-0.03 MG tablet Take 1 tablet by mouth daily.   Yes [provider]  losartan (COZAAR) 100 MG tablet Take 100 mg by mouth daily. 09/09/19  Yes [provider]  doxycycline (VIBRAMYCIN) 100 MG capsule Take 1 capsule (100 mg total) by mouth 2 (two) times daily. 04/14/20   Tommie Sams, DO    Family History Family History  Problem Relation Age of Onset  . Irritable bowel syndrome Mother   . Nephrolithiasis Mother   . Dementia Mother   . Alzheimer's disease Mother   . Heart attack Father     Social History Social History   Tobacco Use  . Smoking status: Former Smoker    Quit date: 11/04/1985    Years since quitting: 34.7  . Smokeless tobacco: Never Used  Vaping Use  .  Vaping Use: Never used  Substance Use Topics  . Alcohol use: Yes    Alcohol/week: 3.0 standard drinks    Types: 3 Glasses of wine per week  . Drug use: No     Allergies   Penicillins, Amoxicillin, and Shellfish allergy   Review of Systems Review of Systems  Constitutional: Negative for activity change, appetite change, fatigue and fever.  HENT: Negative for congestion, ear discharge, rhinorrhea, sinus pressure, sinus pain and sore throat.   Respiratory: Positive for cough, shortness of breath and wheezing.   Cardiovascular: Negative for chest pain.  Gastrointestinal: Positive for diarrhea. Negative for nausea and vomiting.  Genitourinary: Negative for difficulty urinating.  Musculoskeletal: Negative for arthralgias and myalgias.  Skin: Negative.   Neurological: Positive for headaches.  Hematological: Negative.   Psychiatric/Behavioral: Negative.      Physical Exam Triage  Vital Signs ED Triage Vitals  Enc Vitals Group     BP 07/26/20 1927 (!) 140/100     Pulse Rate 07/26/20 1927 77     Resp 07/26/20 1927 18     Temp 07/26/20 1927 98.4 F (36.9 C)     Temp Source 07/26/20 1927 Oral     SpO2 07/26/20 1927 100 %     Weight 07/26/20 1928 151 lb (68.5 kg)     Height 07/26/20 1928 5\' 4"  (1.626 m)     Head Circumference --      Peak Flow --      Pain Score 07/26/20 1928 0     Pain Loc --      Pain Edu? --      Excl. in GC? --    No data found.  Updated Vital Signs BP (!) 140/100 (BP Location: Left Arm)   Pulse 77   Temp 98.4 F (36.9 C) (Oral)   Resp 18   Ht 5\' 4"  (1.626 m)   Wt 151 lb (68.5 kg)   LMP 05/01/2020 (Approximate)   SpO2 100%   BMI 25.92 kg/m   Visual Acuity Right Eye Distance:   Left Eye Distance:   Bilateral Distance:    Right Eye Near:   Left Eye Near:    Bilateral Near:     Physical Exam Vitals and nursing note reviewed.  Constitutional:      General: She is not in acute distress.    Appearance: She is well-developed. She is not ill-appearing.  HENT:     Head: Normocephalic and atraumatic.     Mouth/Throat:     Mouth: Mucous membranes are moist.     Pharynx: Oropharynx is clear.  Eyes:     Extraocular Movements: Extraocular movements intact.     Pupils: Pupils are equal, round, and reactive to light.  Cardiovascular:     Rate and Rhythm: Normal rate and regular rhythm.     Heart sounds: Normal heart sounds.  Pulmonary:     Effort: Pulmonary effort is normal.     Breath sounds: Normal breath sounds. No wheezing or rhonchi.  Musculoskeletal:        General: Normal range of motion.     Cervical back: Normal range of motion and neck supple.  Lymphadenopathy:     Cervical: No cervical adenopathy.  Skin:    General: Skin is warm and dry.     Capillary Refill: Capillary refill takes less than 2 seconds.  Neurological:     Mental Status: She is alert.  Psychiatric:        Mood and Affect: Mood normal.  Speech: Speech normal.        Behavior: Behavior normal.      UC Treatments / Results  Labs (all labs ordered are listed, but only abnormal results are displayed) Labs Reviewed  SARS CORONAVIRUS 2 (TAT 6-24 HRS)    EKG   Radiology No results found.  Procedures Procedures (including critical care time)  Medications Ordered in UC Medications - No data to display  Initial Impression / Assessment and Plan / UC Course  I have reviewed the triage vital signs and the nursing notes.  Pertinent labs & imaging results that were available during my care of the patient were reviewed by me and considered in my medical decision making (see chart for details).   Patient is here for evaluation of chest tightness that has developed since being treated for a sinus infection 2 weeks ago. She reports that as the infection has been draining it has caused some chest tightness and wheezing. Her PCP prescribed and inhaler which has helped.  Lungs are clear and no wheezing auscultated. Her BP is elevated, she has been taking her medication but has also been taking Delsym and Nyquil. Discussed how these can elevate her BP and recommended Coricidin HBP and tessalon perles. Patient is in agreement.  Will D/C home with tessalon perles and have her continue her inhaler.   Final Clinical Impressions(s) / UC Diagnoses   Final diagnoses:  Wheezing     Discharge Instructions     Continue to use your Albuterol inhaler as needed for chest tightness and wheezing.  Stop taking Nyquil and Delsym as these are probably elevating your blood pressure. Use OTC Coricidin HBP instead. Also, use the Tessalon perles every 8 hours as needed for cough.  Quarantine at home until your COVID test comes back. If it is positive you will need to quarantine for 10 days from the start of your symptoms. You can break quarantine if after 10 days your symptoms have improved and you have not had to take medication for a fever  for 24 hours.     ED Prescriptions    None     PDMP not reviewed this encounter.   Becky Augusta, NP 07/26/20 2016

## 2020-07-26 NOTE — Discharge Instructions (Addendum)
Continue to use your Albuterol inhaler as needed for chest tightness and wheezing.  Stop taking Nyquil and Delsym as these are probably elevating your blood pressure. Use OTC Coricidin HBP instead. Also, use the Tessalon perles every 8 hours as needed for cough.  Quarantine at home until your COVID test comes back. If it is positive you will need to quarantine for 10 days from the start of your symptoms. You can break quarantine if after 10 days your symptoms have improved and you have not had to take medication for a fever for 24 hours.

## 2020-07-27 ENCOUNTER — Telehealth: Payer: Self-pay

## 2020-07-27 DIAGNOSIS — U071 COVID-19: Secondary | ICD-10-CM

## 2020-07-27 LAB — SARS CORONAVIRUS 2 (TAT 6-24 HRS): SARS Coronavirus 2: POSITIVE — AB

## 2020-07-27 NOTE — Telephone Encounter (Signed)
Called to notify her of +covid results. Pt verbalized understanding of quarantine instructions that were given by provider when she was seen. She had no other questions or concerns.

## 2020-07-29 DIAGNOSIS — Z1211 Encounter for screening for malignant neoplasm of colon: Secondary | ICD-10-CM | POA: Diagnosis not present

## 2020-08-02 LAB — EXTERNAL GENERIC LAB PROCEDURE: COLOGUARD: NEGATIVE

## 2020-08-02 LAB — COLOGUARD: COLOGUARD: NEGATIVE

## 2020-08-02 MED ORDER — BENZONATATE 200 MG PO CAPS: 200.0000 mg | ORAL_CAPSULE | Freq: Three times a day (TID) | ORAL | 0 refills | Status: AC

## 2020-09-09 DIAGNOSIS — J019 Acute sinusitis, unspecified: Secondary | ICD-10-CM | POA: Diagnosis not present

## 2020-09-09 DIAGNOSIS — B9689 Other specified bacterial agents as the cause of diseases classified elsewhere: Secondary | ICD-10-CM | POA: Diagnosis not present

## 2020-09-09 DIAGNOSIS — R399 Unspecified symptoms and signs involving the genitourinary system: Secondary | ICD-10-CM | POA: Diagnosis not present

## 2020-10-05 DIAGNOSIS — J0101 Acute recurrent maxillary sinusitis: Secondary | ICD-10-CM | POA: Diagnosis not present

## 2020-10-05 DIAGNOSIS — J029 Acute pharyngitis, unspecified: Secondary | ICD-10-CM | POA: Diagnosis not present

## 2020-12-01 DIAGNOSIS — Z1231 Encounter for screening mammogram for malignant neoplasm of breast: Secondary | ICD-10-CM | POA: Diagnosis not present

## 2020-12-12 DIAGNOSIS — Z880 Allergy status to penicillin: Secondary | ICD-10-CM | POA: Diagnosis not present

## 2020-12-12 DIAGNOSIS — G43909 Migraine, unspecified, not intractable, without status migrainosus: Secondary | ICD-10-CM | POA: Diagnosis not present

## 2020-12-12 DIAGNOSIS — Z79899 Other long term (current) drug therapy: Secondary | ICD-10-CM | POA: Diagnosis not present

## 2020-12-12 DIAGNOSIS — Z01419 Encounter for gynecological examination (general) (routine) without abnormal findings: Secondary | ICD-10-CM | POA: Diagnosis not present

## 2020-12-12 DIAGNOSIS — Z1239 Encounter for other screening for malignant neoplasm of breast: Secondary | ICD-10-CM | POA: Diagnosis not present

## 2020-12-12 DIAGNOSIS — C50919 Malignant neoplasm of unspecified site of unspecified female breast: Secondary | ICD-10-CM | POA: Diagnosis not present

## 2020-12-12 DIAGNOSIS — I10 Essential (primary) hypertension: Secondary | ICD-10-CM | POA: Diagnosis not present

## 2020-12-12 DIAGNOSIS — Z3041 Encounter for surveillance of contraceptive pills: Secondary | ICD-10-CM | POA: Diagnosis not present

## 2020-12-13 DIAGNOSIS — R928 Other abnormal and inconclusive findings on diagnostic imaging of breast: Secondary | ICD-10-CM | POA: Diagnosis not present

## 2021-01-02 DIAGNOSIS — G4709 Other insomnia: Secondary | ICD-10-CM | POA: Diagnosis not present

## 2021-01-02 DIAGNOSIS — Z23 Encounter for immunization: Secondary | ICD-10-CM | POA: Diagnosis not present

## 2021-01-02 DIAGNOSIS — I10 Essential (primary) hypertension: Secondary | ICD-10-CM | POA: Diagnosis not present

## 2021-03-26 DIAGNOSIS — Z803 Family history of malignant neoplasm of breast: Secondary | ICD-10-CM | POA: Diagnosis not present

## 2021-03-26 DIAGNOSIS — M25472 Effusion, left ankle: Secondary | ICD-10-CM | POA: Diagnosis not present

## 2021-03-26 DIAGNOSIS — M25572 Pain in left ankle and joints of left foot: Secondary | ICD-10-CM | POA: Diagnosis not present

## 2021-03-26 DIAGNOSIS — G43909 Migraine, unspecified, not intractable, without status migrainosus: Secondary | ICD-10-CM | POA: Diagnosis not present

## 2021-03-26 DIAGNOSIS — S99912A Unspecified injury of left ankle, initial encounter: Secondary | ICD-10-CM | POA: Diagnosis not present

## 2021-03-26 DIAGNOSIS — Z79899 Other long term (current) drug therapy: Secondary | ICD-10-CM | POA: Diagnosis not present

## 2021-03-26 DIAGNOSIS — Z880 Allergy status to penicillin: Secondary | ICD-10-CM | POA: Diagnosis not present

## 2021-03-26 DIAGNOSIS — Z833 Family history of diabetes mellitus: Secondary | ICD-10-CM | POA: Diagnosis not present

## 2021-03-26 DIAGNOSIS — S93402A Sprain of unspecified ligament of left ankle, initial encounter: Secondary | ICD-10-CM | POA: Diagnosis not present

## 2021-03-26 DIAGNOSIS — Z8249 Family history of ischemic heart disease and other diseases of the circulatory system: Secondary | ICD-10-CM | POA: Diagnosis not present

## 2021-03-26 DIAGNOSIS — I10 Essential (primary) hypertension: Secondary | ICD-10-CM | POA: Diagnosis not present

## 2021-04-05 ENCOUNTER — Other Ambulatory Visit: Payer: Self-pay

## 2021-04-05 ENCOUNTER — Encounter: Payer: Self-pay | Admitting: Emergency Medicine

## 2021-04-05 ENCOUNTER — Ambulatory Visit (INDEPENDENT_AMBULATORY_CARE_PROVIDER_SITE_OTHER): Payer: 59

## 2021-04-05 ENCOUNTER — Ambulatory Visit
Admission: EM | Admit: 2021-04-05 | Discharge: 2021-04-05 | Disposition: A | Discharge status: Home / Self Care | Payer: 59 | Attending: Family Medicine | Admitting: Family Medicine

## 2021-04-05 DIAGNOSIS — S93402A Sprain of unspecified ligament of left ankle, initial encounter: Secondary | ICD-10-CM

## 2021-04-05 DIAGNOSIS — M7989 Other specified soft tissue disorders: Secondary | ICD-10-CM | POA: Diagnosis not present

## 2021-04-05 DIAGNOSIS — M25572 Pain in left ankle and joints of left foot: Secondary | ICD-10-CM

## 2021-04-05 DIAGNOSIS — S9002XA Contusion of left ankle, initial encounter: Secondary | ICD-10-CM

## 2021-04-05 MED ORDER — MELOXICAM 15 MG PO TABS: 15.0000 mg | ORAL_TABLET | Freq: Every day | ORAL | 0 refills | Status: DC | PRN

## 2021-04-05 NOTE — ED Provider Notes (Signed)
[facility]    CSN: [id number] Arrival date & time: 04/05/21  1813      History   Chief Complaint Chief Complaint  Patient presents with  . Foot Injury   HPI  50 year old female presents with the above complaint  Patient recently injured her left foot and ankle.  She was seen in the ER on 5/22.  She had negative x-ray imaging.  Patient reports that she continues to have pain and she has bruising as well.  Patient is primarily experiencing pain of the lateral left ankle.  Pain is 6/10 in severity.  She reports decreased range of motion.  Patient is concerned about possibility of occult fracture.  Requesting repeat imaging today.  No other complaints.  Past Medical History:  Diagnosis Date  . GERD (gastroesophageal reflux disease)   . Hypertension   . Patient denies medical problems   . Seasonal allergies   . UTI (lower urinary tract infection)     Patient Active Problem List   Diagnosis Date Noted  . Allergy 09/15/2018  . Migraine headache 09/15/2018  . Recurrent urinary tract infection 09/15/2018  . Encounter for routine gynecological examination 03/13/2014  . Positive H. pylori test 06/19/2013  . Microscopic hematuria 06/09/2012    Past Surgical History:  Procedure Laterality Date  . KNEE SURGERY Left   . MOUTH SURGERY      OB History    Gravida  1   Para  0   Term      Preterm      AB  1   Living        SAB      IAB      Ectopic      Multiple      Live Births               Home Medications    Prior to Admission medications   Medication Sig Start Date End Date Taking? Authorizing Provider  meloxicam (MOBIC) 15 MG tablet Take 1 tablet (15 mg total) by mouth daily as needed for pain. 04/05/21  Yes Javier Mamone G, DO  albuterol (VENTOLIN HFA) 108 (90 Base) MCG/ACT inhaler SMARTSIG:2 Inhalation Via Inhaler Every 4 Hours PRN 07/18/20   [provider]  famotidine (PEPCID) 40 MG tablet Take 40 mg by mouth daily.    [provider]  fexofenadine (ALLEGRA) 180 MG tablet Take 180 mg by mouth daily.    [provider]  fluticasone (FLONASE) 50 MCG/ACT nasal spray Place 2 sprays into both nostrils daily. 01/01/20   [provider]  levonorgestrel-ethinyl estradiol (SEASONALE,INTROVALE,JOLESSA) 0.15-0.03 MG tablet Take 1 tablet by mouth daily.    [provider]  losartan (COZAAR) 100 MG tablet Take 100 mg by mouth daily. 09/09/19   [provider]    Family History Family History  Problem Relation Age of Onset  . Irritable bowel syndrome Mother   . Nephrolithiasis Mother   . Dementia Mother   . Alzheimer's disease Mother   . Heart attack Father     Social History Social History   Tobacco Use  . Smoking status: Former Smoker    Quit date: 11/04/1985    Years since quitting: 35.4  . Smokeless tobacco: Never Used  Vaping Use  . Vaping Use: Never used  Substance Use Topics  . Alcohol use: Yes    Alcohol/week: 3.0 standard drinks    Types: 3 Glasses of wine per week  . Drug use: No  Allergies   Penicillins, Amoxicillin, and Shellfish allergy   Review of Systems Review of Systems Per HPI  Physical Exam Triage Vital Signs ED Triage Vitals  Enc Vitals Group     BP 04/05/21 1831 128/89     Pulse Rate 04/05/21 1831 63     Resp 04/05/21 1831 20     Temp 04/05/21 1831 98.5 F (36.9 C)     Temp Source 04/05/21 1831 Oral     SpO2 04/05/21 1831 100 %     Weight --      Height --      Head Circumference --      Peak Flow --      Pain Score 04/05/21 1832 6     Pain Loc --      Pain Edu? --      Excl. in GC? --    Updated Vital Signs BP 128/89 (BP Location: Left Arm)   Pulse 63   Temp 98.5 F (36.9 C) (Oral)   Resp 20   SpO2 100%   Visual Acuity Right Eye Distance:   Left Eye Distance:   Bilateral Distance:    Right Eye Near:   Left Eye Near:    Bilateral Near:     Physical Exam Vitals and nursing note reviewed.  Constitutional:       General: She is not in acute distress.    Appearance: Normal appearance. She is not ill-appearing.  HENT:     Head: Normocephalic and atraumatic.  Eyes:     General:        Right eye: No discharge.        Left eye: No discharge.     Conjunctiva/sclera: Conjunctivae normal.  Musculoskeletal:     Comments: Right foot and ankle: Right foot with bruising but no discrete areas of tenderness to palpation. Right ankle with extensive bruising laterally and exquisite tenderness over the lateral malleolus.  Decreased range of motion.  Neurological:     Mental Status: She is alert.  Psychiatric:        Mood and Affect: Mood normal.        Behavior: Behavior normal.    UC Treatments / Results  Labs (all labs ordered are listed, but only abnormal results are displayed) Labs Reviewed - No data to display  EKG   Radiology DG Ankle Complete Left  Result Date: 04/05/2021 CLINICAL DATA:  Reason injury, lateral pain EXAM: LEFT ANKLE COMPLETE - 3+ VIEW COMPARISON:  None. FINDINGS: Lateral soft tissue swelling. No acute bony abnormality. Specifically, no fracture, subluxation, or dislocation. IMPRESSION: No acute bony abnormality. Electronically Signed   By: Charlett Nose M.D.   On: 04/05/2021 19:07    Procedures Procedures (including critical care time)  Medications Ordered in UC Medications - No data to display  Initial Impression / Assessment and Plan / UC Course  I have reviewed the triage vital signs and the nursing notes.  Pertinent labs & imaging results that were available during my care of the patient were reviewed by me and considered in my medical decision making (see chart for details).    50 year old female presents with continued left ankle pain.  X-ray was obtained and was independent reviewed by me.  Interpretation: No evidence of fracture.  Advise rest, ice, elevation.  Meloxicam as directed.  Supportive care.  Final Clinical Impressions(s) / UC Diagnoses   Final  diagnoses:  Sprain of left ankle, unspecified ligament, initial encounter  Contusion of left ankle, initial encounter  Discharge Instructions     Continue to ice and elevate.  Medication as prescribed.  Take care  Dr. Adriana Simas    ED Prescriptions    Medication Sig Dispense Auth. Provider   meloxicam (MOBIC) 15 MG tablet Take 1 tablet (15 mg total) by mouth daily as needed for pain. 30 tablet Tommie Sams, DO     PDMP not reviewed this encounter.   Tommie Sams, [place] 04/05/21 1927

## 2021-04-05 NOTE — ED Triage Notes (Signed)
Pt reports she inj her left foot/ankle onset 1 week associated w/swelling, bruising and pain  Reports she went to the ED at [facility] -- xray was negative then  Pain is 6/10 and is concerned for fx  A&O x4... NAD.Alyssa Valencia. steady gait... has a brace on foot

## 2021-04-05 NOTE — Discharge Instructions (Signed)
Continue to ice and elevate.  Medication as prescribed.  Take care  Dr. Adriana Simas

## 2021-04-25 ENCOUNTER — Other Ambulatory Visit: Payer: Self-pay

## 2021-04-25 ENCOUNTER — Ambulatory Visit
Admission: RE | Admit: 2021-04-25 | Discharge: 2021-04-25 | Disposition: A | Discharge status: Home / Self Care | Payer: 59 | Source: Ambulatory Visit | Attending: Emergency Medicine | Admitting: Emergency Medicine

## 2021-04-25 VITALS — BP 116/66 | HR 71 | Temp 98.4°F | Resp 16 | Wt 146.0 lb

## 2021-04-25 DIAGNOSIS — J0140 Acute pansinusitis, unspecified: Secondary | ICD-10-CM

## 2021-04-25 DIAGNOSIS — N390 Urinary tract infection, site not specified: Secondary | ICD-10-CM | POA: Diagnosis not present

## 2021-04-25 DIAGNOSIS — R319 Hematuria, unspecified: Secondary | ICD-10-CM

## 2021-04-25 LAB — URINALYSIS, COMPLETE (UACMP) WITH MICROSCOPIC
Bilirubin Urine: NEGATIVE
Glucose, UA: NEGATIVE mg/dL
Ketones, ur: NEGATIVE mg/dL
Nitrite: POSITIVE — AB
Protein, ur: NEGATIVE mg/dL
Specific Gravity, Urine: <1.005 — ABNORMAL LOW (ref 1.005–1.030)
pH: 5.5 (ref 5.0–8.0)

## 2021-04-25 MED ORDER — FLUCONAZOLE 150 MG PO TABS: 150.0000 mg | ORAL_TABLET | Freq: Once | ORAL | 1 refills | Status: AC

## 2021-04-25 MED ORDER — MOMETASONE FUROATE 50 MCG/ACT NA SUSP: 2.0000 | Freq: Every day | NASAL | 0 refills | Status: DC

## 2021-04-25 MED ORDER — AMOXICILLIN-POT CLAVULANATE 875-125 MG PO TABS: 1.0000 | ORAL_TABLET | Freq: Two times a day (BID) | ORAL | 0 refills | Status: DC

## 2021-04-25 MED ORDER — PHENAZOPYRIDINE HCL 200 MG PO TABS: 200.0000 mg | ORAL_TABLET | Freq: Three times a day (TID) | ORAL | 0 refills | Status: DC | PRN

## 2021-04-25 NOTE — Discharge Instructions (Addendum)
Continue the Allegra.  Stop the Flonase and start Nasonex.  This will stop the nosebleeds.  You may take 600 mg of motrin with 1000 mg of tylenol up to 3-4 times a day as needed for pain. This is an effective combination for pain. Use a NeilMed sinus rinse with distilled water as often as you want to to reduce nasal congestion. Follow the directions on the box.   I have sent your urine off for culture to make sure that we have you on the right antibiotic.  Give [place] a working phone number so we can contact you if we need to change anything. Finish the Augmentin, even if you feel better.  Push extra fluids.  Take Pyridium as needed for urinary symptoms.   Go to www.goodrx.com to look up your medications. This will give you a list of where you can find your prescriptions at the most affordable prices. Or you can ask the pharmacist what the cash price is. This is frequently cheaper than going through insurance.

## 2021-04-25 NOTE — ED Triage Notes (Signed)
Pt c/o sinus congestion and facial pain for the last week, states now she is dealing with some nose bleeding. Pt also c/o burning during urination and urgency since yesterday.

## 2021-04-25 NOTE — ED Provider Notes (Signed)
HPI  SUBJECTIVE:  Alyssa Valencia is a 50 y.o. female who presents with 2 issues:  First, she reports several weeks of allergy symptoms with nasal congestion, rhinorrhea, maxillary sinus pain and pressure, sneezing, postnasal drip, mild sore throat.  She reports purulent/greenish rhinorrhea starting several days ago and epistaxis when she blows her nose.  She has tried Flonase, humidifier, hot showers, Allegra and warm compresses.  Everything except the Flonase helps.  Symptoms are worse with being outside.  No facial swelling, upper dental pain, fevers, chills, cough.  No antibiotics in the past month.  No antipyretic in the past 6 hours.  Second, she reports 3 days of dysuria, urgency, frequency, cloudy, odorous urine and hematuria.  The hematuria is not something new.  No vaginal odor, bleeding, discharge, rash.  No fevers, abdominal, pelvic, back pain.  She is in a long-term monogamous relationship with her husband who is asymptomatic.  STDs are not a concern today.  She tried cranberry juice without improvement in her symptoms.  Symptoms are better with taking hot showers, Azo, worse with urinating.  She has a past medical history of UTI, hematuria, allergies.  States that she has 2-3 episodes of sinusitis a year.  Also has a history of BV, yeast.  No history of pyelonephritis, nephrolithiasis, diabetes, chronic kidney disease, STDs.  PMD: [facility].    Past Medical History:  Diagnosis Date   GERD (gastroesophageal reflux disease)    Hypertension    Patient denies medical problems    Seasonal allergies    UTI (lower urinary tract infection)     Past Surgical History:  Procedure Laterality Date   KNEE SURGERY Left    MOUTH SURGERY      Family History  Problem Relation Age of Onset   Irritable bowel syndrome Mother    Nephrolithiasis Mother    Dementia Mother    Alzheimer's disease Mother    Heart attack Father     Social History   Tobacco Use   Smoking status: Former     Pack years: 0.00    Types: Cigarettes    Quit date: 11/04/1985    Years since quitting: 35.4   Smokeless tobacco: Never  Vaping Use   Vaping Use: Never used  Substance Use Topics   Alcohol use: Yes    Alcohol/week: 3.0 standard drinks    Types: 3 Glasses of wine per week   Drug use: No    No current facility-administered medications for this encounter.  Current Outpatient Medications:    albuterol (VENTOLIN HFA) 108 (90 Base) MCG/ACT inhaler, SMARTSIG:2 Inhalation Via Inhaler Every 4 Hours PRN, Disp: , Rfl:    amoxicillin-clavulanate (AUGMENTIN) 875-125 MG tablet, Take 1 tablet by mouth 2 (two) times daily. X 7 days, Disp: 14 tablet, Rfl: 0   famotidine (PEPCID) 40 MG tablet, Take 40 mg by mouth daily., Disp: , Rfl:    fexofenadine (ALLEGRA) 180 MG tablet, Take 180 mg by mouth daily., Disp: , Rfl:    fluconazole (DIFLUCAN) 150 MG tablet, Take 1 tablet (150 mg total) by mouth once for 1 dose. 1 tab po x 1. May repeat in 72 hours if no improvement, Disp: 2 tablet, Rfl: 1   levonorgestrel-ethinyl estradiol (SEASONALE,INTROVALE,JOLESSA) 0.15-0.03 MG tablet, Take 1 tablet by mouth daily., Disp: , Rfl:    losartan (COZAAR) 100 MG tablet, Take 100 mg by mouth daily., Disp: , Rfl:    meloxicam (MOBIC) 15 MG tablet, Take 1 tablet (15 mg total) by mouth daily  as needed for pain., Disp: 30 tablet, Rfl: 0   mometasone (NASONEX) 50 MCG/ACT nasal spray, Place 2 sprays into the nose daily., Disp: 17 g, Rfl: 0   phenazopyridine (PYRIDIUM) 200 MG tablet, Take 1 tablet (200 mg total) by mouth 3 (three) times daily as needed for pain., Disp: 6 tablet, Rfl: 0  Allergies  Allergen Reactions   Penicillins     "Drowsy"   Amoxicillin Rash   Shellfish Allergy Rash     ROS  As noted in HPI.   Physical Exam  BP 116/66 (BP Location: Right Arm)   Pulse 71   Temp 98.4 F (36.9 C) (Oral)   Resp 16   Wt 66.2 kg   SpO2 99%   BMI 25.06 kg/m   Constitutional: Well developed, well nourished, no  acute distress Eyes:  EOMI, conjunctiva normal bilaterally HENT: Normocephalic, atraumatic,mucus membranes moist.  Friable nasal mucosa.  Purulent nasal drainage.  Normal sized turbinates.  Positive maxillary, frontal sinus tenderness.  Slightly erythematous oropharynx with tonsils normal size without exudates.  Uvula midline.  No obvious postnasal drip. Neck: No cervical lymphadenopathy Respiratory: Normal inspiratory effort Cardiovascular: Normal rate GI: nondistended, soft, nontender, no suprapubic, flank tenderness Back: No CVAT skin: No rash, skin intact Musculoskeletal: no deformities Neurologic: Alert & oriented x 3, no focal neuro deficits Psychiatric: Speech and behavior appropriate   ED Course   Medications - No data to display  Orders Placed This Encounter  Procedures   Urine culture    Standing Status:   Standing    Number of Occurrences:   1    Order Specific Question:   List patient's active antibiotics    Answer:   augmentin   Urinalysis, Complete w Microscopic    Standing Status:   Standing    Number of Occurrences:   1    Results for orders placed or performed during the hospital encounter of 04/25/21 (from the past 24 hour(s))  Urinalysis, Complete w Microscopic Urine, Clean Catch     Status: Abnormal   Collection Time: 04/25/21 12:55 PM  Result Value Ref Range   Color, Urine YELLOW YELLOW   APPearance HAZY (A) CLEAR   Specific Gravity, Urine <1.005 (L) 1.005 - 1.030   pH 5.5 5.0 - 8.0   Glucose, UA NEGATIVE NEGATIVE mg/dL   Hgb urine dipstick MODERATE (A) NEGATIVE   Bilirubin Urine NEGATIVE NEGATIVE   Ketones, ur NEGATIVE NEGATIVE mg/dL   Protein, ur NEGATIVE NEGATIVE mg/dL   Nitrite POSITIVE (A) NEGATIVE   Leukocytes,Ua MODERATE (A) NEGATIVE   Squamous Epithelial / LPF 0-5 0 - 5   WBC, UA 21-50 0 - 5 WBC/hpf   RBC / HPF 0-5 0 - 5 RBC/hpf   Bacteria, UA MANY (A) NONE SEEN   WBC Clumps PRESENT    No results found.  ED Clinical Impression  1.  Acute non-recurrent pansinusitis   2. Urinary tract infection with hematuria, site unspecified      ED Assessment/Plan  1.  Sinusitis.  Patient has had symptoms for several weeks, she has maxillary, frontal sinus tenderness and purulent nasal drainage.  Will send home with Augmentin for the sinus infection.  Will discontinue Flonase, start Nasonex, saline nasal irrigation.   2.  UTI.  She has positive nitrite, esterase, many bacteria and pyuria.  Sending this off for culture to confirm antibiotic choice.  Last urine culture that grew out anything was an E. coli UTI that was pansensitive including to ampicillin/sulbactam. will send home  with Augmentin which will also cover a sinus infection, Pyridium, and Diflucan as she states that she "always gets a yeast infection" when she takes antibiotics.  Discussed labs, MDM, treatment plan, and plan for follow-up with patient. Discussed sn/sx that should prompt return to the ED. patient agrees with plan.   Meds ordered this encounter  Medications   amoxicillin-clavulanate (AUGMENTIN) 875-125 MG tablet    Sig: Take 1 tablet by mouth 2 (two) times daily. X 7 days    Dispense:  14 tablet    Refill:  0   mometasone (NASONEX) 50 MCG/ACT nasal spray    Sig: Place 2 sprays into the nose daily.    Dispense:  17 g    Refill:  0   fluconazole (DIFLUCAN) 150 MG tablet    Sig: Take 1 tablet (150 mg total) by mouth once for 1 dose. 1 tab po x 1. May repeat in 72 hours if no improvement    Dispense:  2 tablet    Refill:  1   phenazopyridine (PYRIDIUM) 200 MG tablet    Sig: Take 1 tablet (200 mg total) by mouth 3 (three) times daily as needed for pain.    Dispense:  6 tablet    Refill:  0      *This clinic note was created using Scientist, clinical (histocompatibility and immunogenetics). Therefore, there may be occasional mistakes despite careful proofreading.  ?    Domenick Gong, MD 04/25/21 1530

## 2021-04-28 LAB — URINE CULTURE: Culture: >=100000 — AB

## 2021-05-03 ENCOUNTER — Other Ambulatory Visit: Payer: Self-pay | Admitting: Family Medicine

## 2021-05-17 ENCOUNTER — Ambulatory Visit
Admission: EM | Admit: 2021-05-17 | Discharge: 2021-05-17 | Disposition: A | Discharge status: Home / Self Care | Payer: 59 | Attending: Family Medicine | Admitting: Family Medicine

## 2021-05-17 ENCOUNTER — Other Ambulatory Visit: Payer: Self-pay

## 2021-05-17 ENCOUNTER — Encounter: Payer: Self-pay | Admitting: Emergency Medicine

## 2021-05-17 DIAGNOSIS — R300 Dysuria: Secondary | ICD-10-CM | POA: Diagnosis not present

## 2021-05-17 DIAGNOSIS — N3000 Acute cystitis without hematuria: Secondary | ICD-10-CM | POA: Insufficient documentation

## 2021-05-17 LAB — URINALYSIS, COMPLETE (UACMP) WITH MICROSCOPIC: WBC, UA: >50 WBC/hpf (ref 0–5)

## 2021-05-17 MED ORDER — FLUCONAZOLE 200 MG PO TABS: 200.0000 mg | ORAL_TABLET | ORAL | 0 refills | Status: AC | PRN

## 2021-05-17 MED ORDER — PHENAZOPYRIDINE HCL 200 MG PO TABS: 200.0000 mg | ORAL_TABLET | Freq: Three times a day (TID) | ORAL | 0 refills | Status: DC

## 2021-05-17 MED ORDER — CEPHALEXIN 500 MG PO CAPS: 500.0000 mg | ORAL_CAPSULE | Freq: Two times a day (BID) | ORAL | 0 refills | Status: AC

## 2021-05-17 NOTE — ED Provider Notes (Signed)
[facility]    CSN: [id number] Arrival date & time: 05/17/21  1320      History   Chief Complaint Chief Complaint  Patient presents with   Dysuria    HPI Alyssa Valencia is a 50 y.o. female presenting for onset of dysuria, urinary frequency and urgency starting today.  Patient was treated for UTI about 2 weeks ago.  She did have a positive culture.  She was treated with 7-day course of Augmentin.  Patient states that her symptoms have recurred.  She states that if she does not receive a 10-day course of an antibiotic then she generally has to be treated twice.  She is concerned that her infection has returned.  She denies any fever, fatigue, chills, abdominal pain or back pain.  No hematuria.  No abnormal vaginal discharge, itching or odor.  Has taken Pyridium.  No other complaints or concerns.  HPI  Past Medical History:  Diagnosis Date   GERD (gastroesophageal reflux disease)    Hypertension    Patient denies medical problems    Seasonal allergies    UTI (lower urinary tract infection)     Patient Active Problem List   Diagnosis Date Noted   Allergy 09/15/2018   Migraine headache 09/15/2018   Recurrent urinary tract infection 09/15/2018   Encounter for routine gynecological examination 03/13/2014   Positive H. pylori test 06/19/2013   Microscopic hematuria 06/09/2012    Past Surgical History:  Procedure Laterality Date   KNEE SURGERY Left    MOUTH SURGERY      OB History     Gravida  1   Para  0   Term      Preterm      AB  1   Living         SAB      IAB      Ectopic      Multiple      Live Births               Home Medications    Prior to Admission medications   Medication Sig Start Date End Date Taking? Authorizing Provider  cephALEXin (KEFLEX) 500 MG capsule Take 1 capsule (500 mg total) by mouth 2 (two) times daily for 7 days. 05/17/21 05/24/21 Yes Shirlee Latch, PA-C  fluconazole (DIFLUCAN) 200 MG tablet Take 1 tablet  (200 mg total) by mouth every 3 (three) days as needed for up to 7 days (yeast infection). 05/17/21 05/24/21 Yes Shirlee Latch, PA-C  phenazopyridine (PYRIDIUM) 200 MG tablet Take 1 tablet (200 mg total) by mouth 3 (three) times daily. 05/17/21  Yes Eusebio Friendly B, PA-C  albuterol (VENTOLIN HFA) 108 (90 Base) MCG/ACT inhaler SMARTSIG:2 Inhalation Via Inhaler Every 4 Hours PRN 07/18/20   [provider]  famotidine (PEPCID) 40 MG tablet Take 40 mg by mouth daily.    [provider]  fexofenadine (ALLEGRA) 180 MG tablet Take 180 mg by mouth daily.    [provider]  levonorgestrel-ethinyl estradiol (SEASONALE,INTROVALE,JOLESSA) 0.15-0.03 MG tablet Take 1 tablet by mouth daily.    [provider]  losartan (COZAAR) 100 MG tablet Take 100 mg by mouth daily. 09/09/19   [provider]  meloxicam (MOBIC) 15 MG tablet Take 1 tablet (15 mg total) by mouth daily as needed for pain. 04/05/21   Cook, Jayce G, DO  mometasone (NASONEX) 50 MCG/ACT nasal spray Place 2 sprays into the nose daily. 04/25/21   Domenick Gong, MD  Family History Family History  Problem Relation Age of Onset   Irritable bowel syndrome Mother    Nephrolithiasis Mother    Dementia Mother    Alzheimer's disease Mother    Heart attack Father     Social History Social History   Tobacco Use   Smoking status: Former    Pack years: 0.00    Types: Cigarettes    Quit date: 11/04/1985    Years since quitting: 35.5   Smokeless tobacco: Never  Vaping Use   Vaping Use: Never used  Substance Use Topics   Alcohol use: Yes    Alcohol/week: 3.0 standard drinks    Types: 3 Glasses of wine per week   Drug use: No     Allergies   Penicillins, Amoxicillin, and Shellfish allergy   Review of Systems Review of Systems  Constitutional:  Negative for chills and fever.  Gastrointestinal:  Negative for abdominal pain, diarrhea, nausea and vomiting.  Genitourinary:  Positive for dysuria,  frequency and urgency. Negative for decreased urine volume, flank pain, hematuria, pelvic pain, vaginal bleeding, vaginal discharge and vaginal pain.  Musculoskeletal:  Negative for back pain.  Skin:  Negative for rash.    Physical Exam Triage Vital Signs ED Triage Vitals  Enc Vitals Group     BP 05/17/21 1346 122/89     Pulse Rate 05/17/21 1346 87     Resp 05/17/21 1346 18     Temp 05/17/21 1346 98.4 F (36.9 C)     Temp src --      SpO2 05/17/21 1346 96 %     Weight --      Height --      Head Circumference --      Peak Flow --      Pain Score 05/17/21 1344 0     Pain Loc --      Pain Edu? --      Excl. in GC? --    No data found.  Updated Vital Signs BP 122/89   Pulse 87   Temp 98.4 F (36.9 C)   Resp 18   SpO2 96%       Physical Exam Vitals and nursing note reviewed.  Constitutional:      General: She is not in acute distress.    Appearance: Normal appearance. She is not ill-appearing or toxic-appearing.  HENT:     Head: Normocephalic and atraumatic.  Eyes:     General: No scleral icterus.       Right eye: No discharge.        Left eye: No discharge.     Conjunctiva/sclera: Conjunctivae normal.  Cardiovascular:     Rate and Rhythm: Normal rate and regular rhythm.     Heart sounds: Normal heart sounds.  Pulmonary:     Effort: Pulmonary effort is normal. No respiratory distress.     Breath sounds: Normal breath sounds.  Abdominal:     Palpations: Abdomen is soft.     Tenderness: There is abdominal tenderness (mild suprapubic TTP). There is no right CVA tenderness or left CVA tenderness.  Musculoskeletal:     Cervical back: Neck supple.  Skin:    General: Skin is dry.  Neurological:     General: No focal deficit present.     Mental Status: She is alert. Mental status is at baseline.     Motor: No weakness.     Gait: Gait normal.  Psychiatric:        Mood and Affect:  Mood normal.        Behavior: Behavior normal.        Thought Content: Thought  content normal.     UC Treatments / Results  Labs (all labs ordered are listed, but only abnormal results are displayed) Labs Reviewed  URINALYSIS, COMPLETE (UACMP) WITH MICROSCOPIC - Abnormal; Notable for the following components:      Result Value   Color, Urine ORANGE (*)    APPearance HAZY (*)    Glucose, UA   (*)    Value: TEST NOT REPORTED DUE TO COLOR INTERFERENCE OF URINE PIGMENT   Hgb urine dipstick   (*)    Value: TEST NOT REPORTED DUE TO COLOR INTERFERENCE OF URINE PIGMENT   Bilirubin Urine   (*)    Value: TEST NOT REPORTED DUE TO COLOR INTERFERENCE OF URINE PIGMENT   Ketones, ur   (*)    Value: TEST NOT REPORTED DUE TO COLOR INTERFERENCE OF URINE PIGMENT   Protein, ur   (*)    Value: TEST NOT REPORTED DUE TO COLOR INTERFERENCE OF URINE PIGMENT   Nitrite   (*)    Value: TEST NOT REPORTED DUE TO COLOR INTERFERENCE OF URINE PIGMENT   Leukocytes,Ua   (*)    Value: TEST NOT REPORTED DUE TO COLOR INTERFERENCE OF URINE PIGMENT   Bacteria, UA MANY (*)    All other components within normal limits  URINE CULTURE    EKG   Radiology No results found.  Procedures Procedures (including critical care time)  Medications Ordered in UC Medications - No data to display  Initial Impression / Assessment and Plan / UC Course  I have reviewed the triage vital signs and the nursing notes.  Pertinent labs & imaging results that were available during my care of the patient were reviewed by me and considered in my medical decision making (see chart for details).  50 year old female presenting for dysuria, urinary frequency and urgency.  Treated for UTI 2 weeks ago with Augmentin 7-day course.  Patient did type Pyridium so urinalysis is affected by this.  UA does show greater than 50 WBCs, 6-10 RBCs and bacteria.  We will send urine for culture and retreat her for UTI at this time with Keflex.  Advised her to increase rest and fluids.  I also sent in Pyridium.  Patient states that she  gets yeast infections when she takes antibiotics I am sent in Diflucan as well.  Advised her to follow-up with [place] as needed.  Advised patient someone will contact her with test of the urine culture and she may be instructed to stop taking it or take a different medication or just complete the antibiotic she is been prescribed.  Patient understanding.  Final Clinical Impressions(s) / UC Diagnoses   Final diagnoses:  Acute cystitis without hematuria  Dysuria     Discharge Instructions      UTI: Based on either symptoms or urinalysis, you may have a urinary tract infection. We will send the urine for culture and call with results in a few days. Begin antibiotics at this time. Your symptoms should be much improved over the next 2-3 days. Increase rest and fluid intake. If for some reason symptoms are worsening or not improving after a couple of days or the urine culture determines the antibiotics you are taking will not treat the infection, the antibiotics may be changed. Return or go to ER for fever, back pain, worsening urinary pain, discharge, increased blood in urine. May  take Tylenol or Motrin OTC for pain relief or consider AZO if no contraindications      ED Prescriptions     Medication Sig Dispense Auth. Provider   cephALEXin (KEFLEX) 500 MG capsule Take 1 capsule (500 mg total) by mouth 2 (two) times daily for 7 days. 14 capsule Eusebio FriendlyEaves, Draven Laine B, PA-C   fluconazole (DIFLUCAN) 200 MG tablet Take 1 tablet (200 mg total) by mouth every 3 (three) days as needed for up to 7 days (yeast infection). 2 tablet Shirlee LatchEaves, Nakesha Ebrahim B, PA-C   phenazopyridine (PYRIDIUM) 200 MG tablet Take 1 tablet (200 mg total) by mouth 3 (three) times daily. 6 tablet Gareth MorganEaves, Rosemarie Galvis B, PA-C      PDMP not reviewed this encounter.   Shirlee Latchaves, Ilaisaane Marts B, PA-C 05/17/21 1451

## 2021-05-17 NOTE — ED Triage Notes (Signed)
Pt is present today with  recurrent UTI sx. Pt states that she finished the last round of antibiotics. Pt states that she noticed her sx started feeling her sx reoccurring. Pt states that she feels dysuria, slight odor, and urgency.

## 2021-05-17 NOTE — Discharge Instructions (Addendum)

## 2021-05-20 LAB — URINE CULTURE: Culture: >=100000 — AB

## 2021-08-03 IMAGING — CR DG ANKLE COMPLETE 3+V*L*
3 series · 3 of 3 positions shown · non-contrast
Comparison: None.

CLINICAL DATA: Reason injury, lateral pain

EXAM:
LEFT ANKLE COMPLETE - 3+ VIEW

[ankle ap]
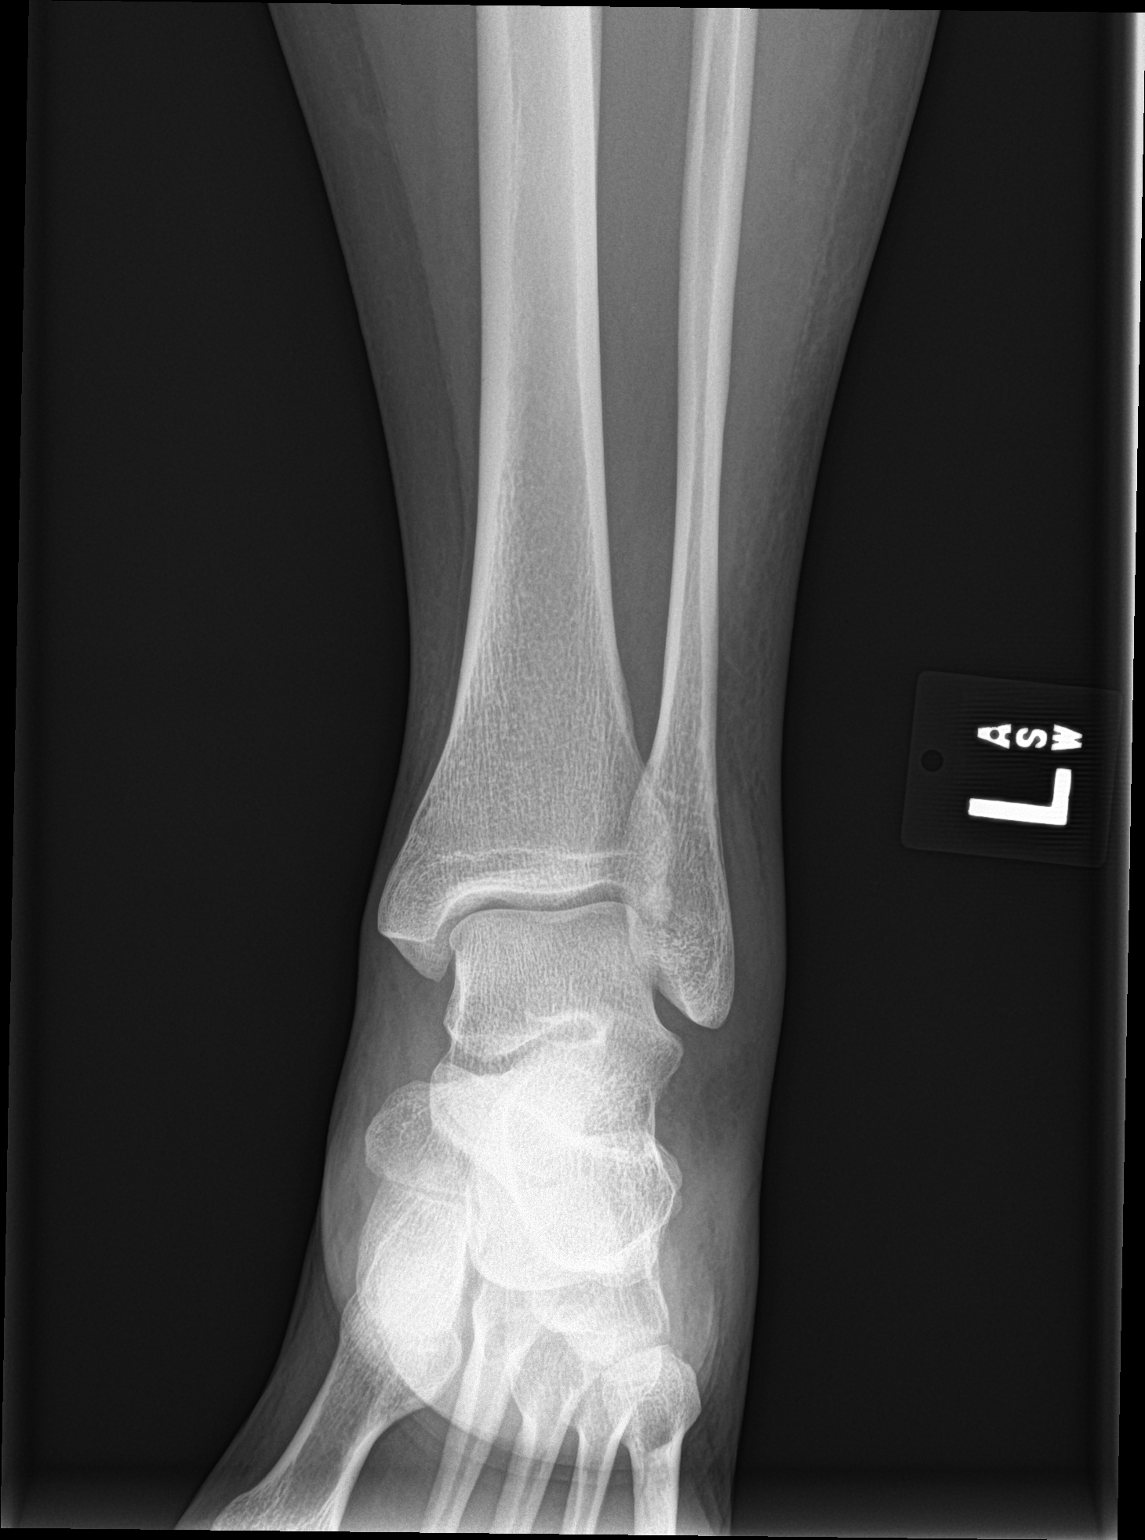

[ankle obl]
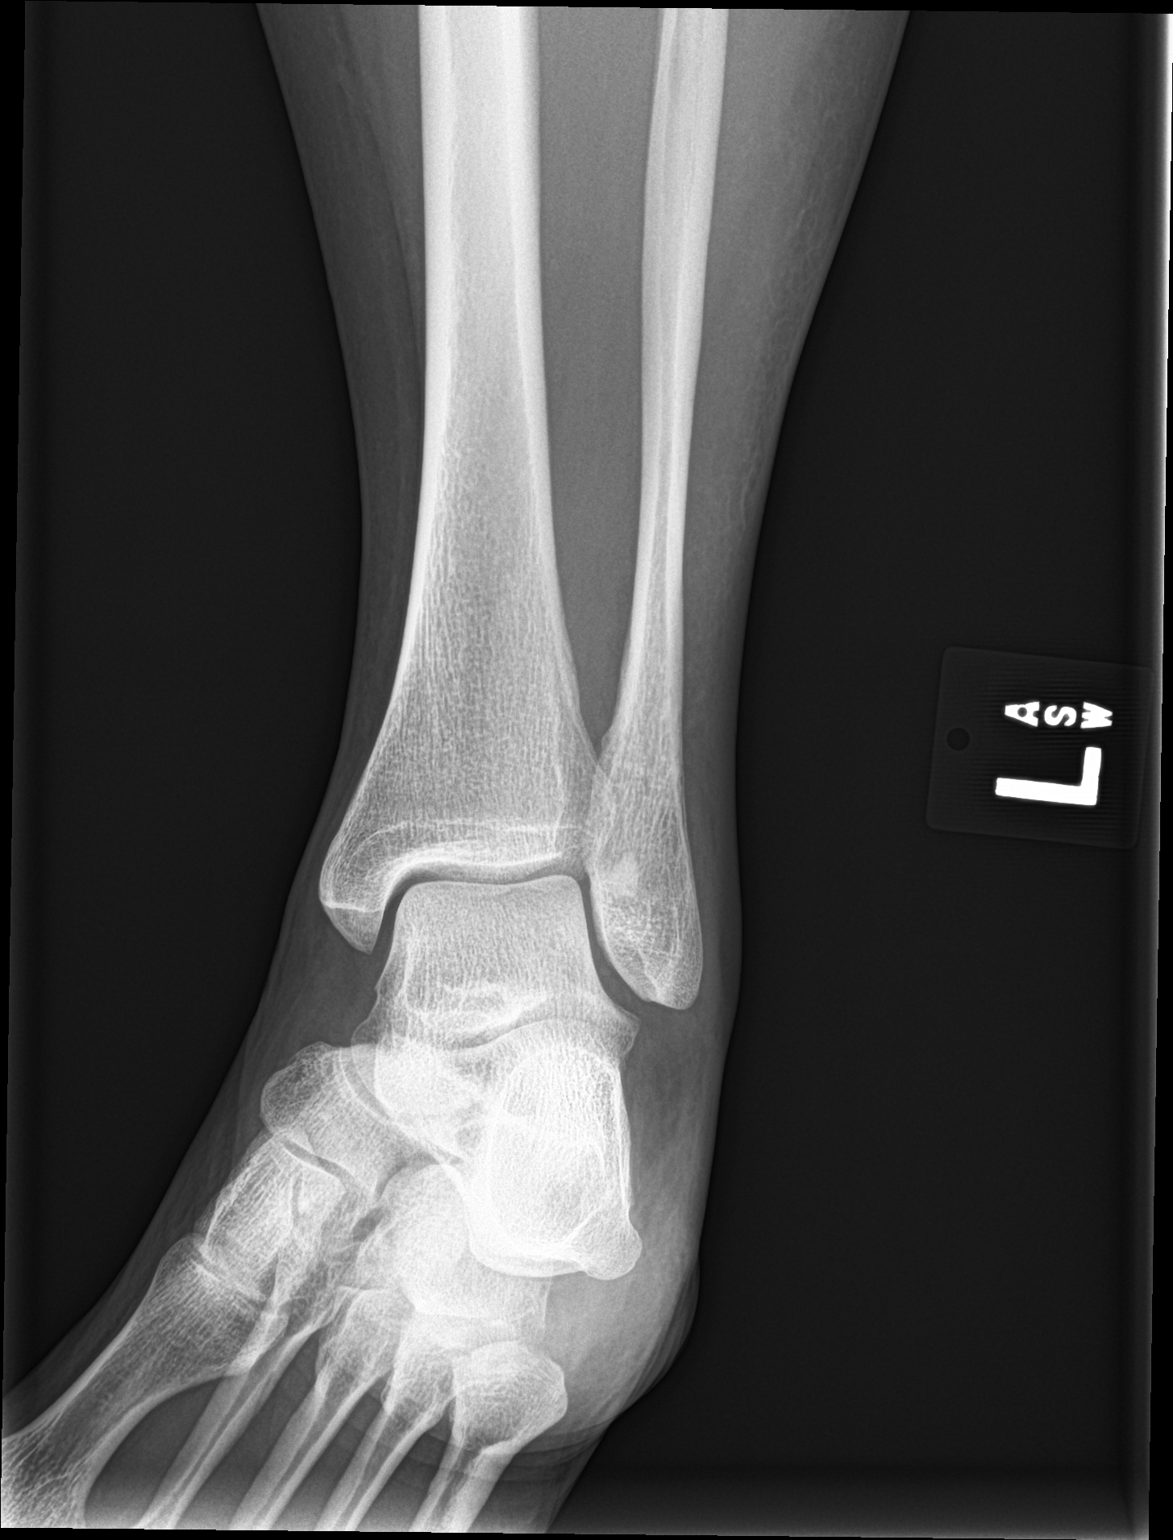

[ankle lat]
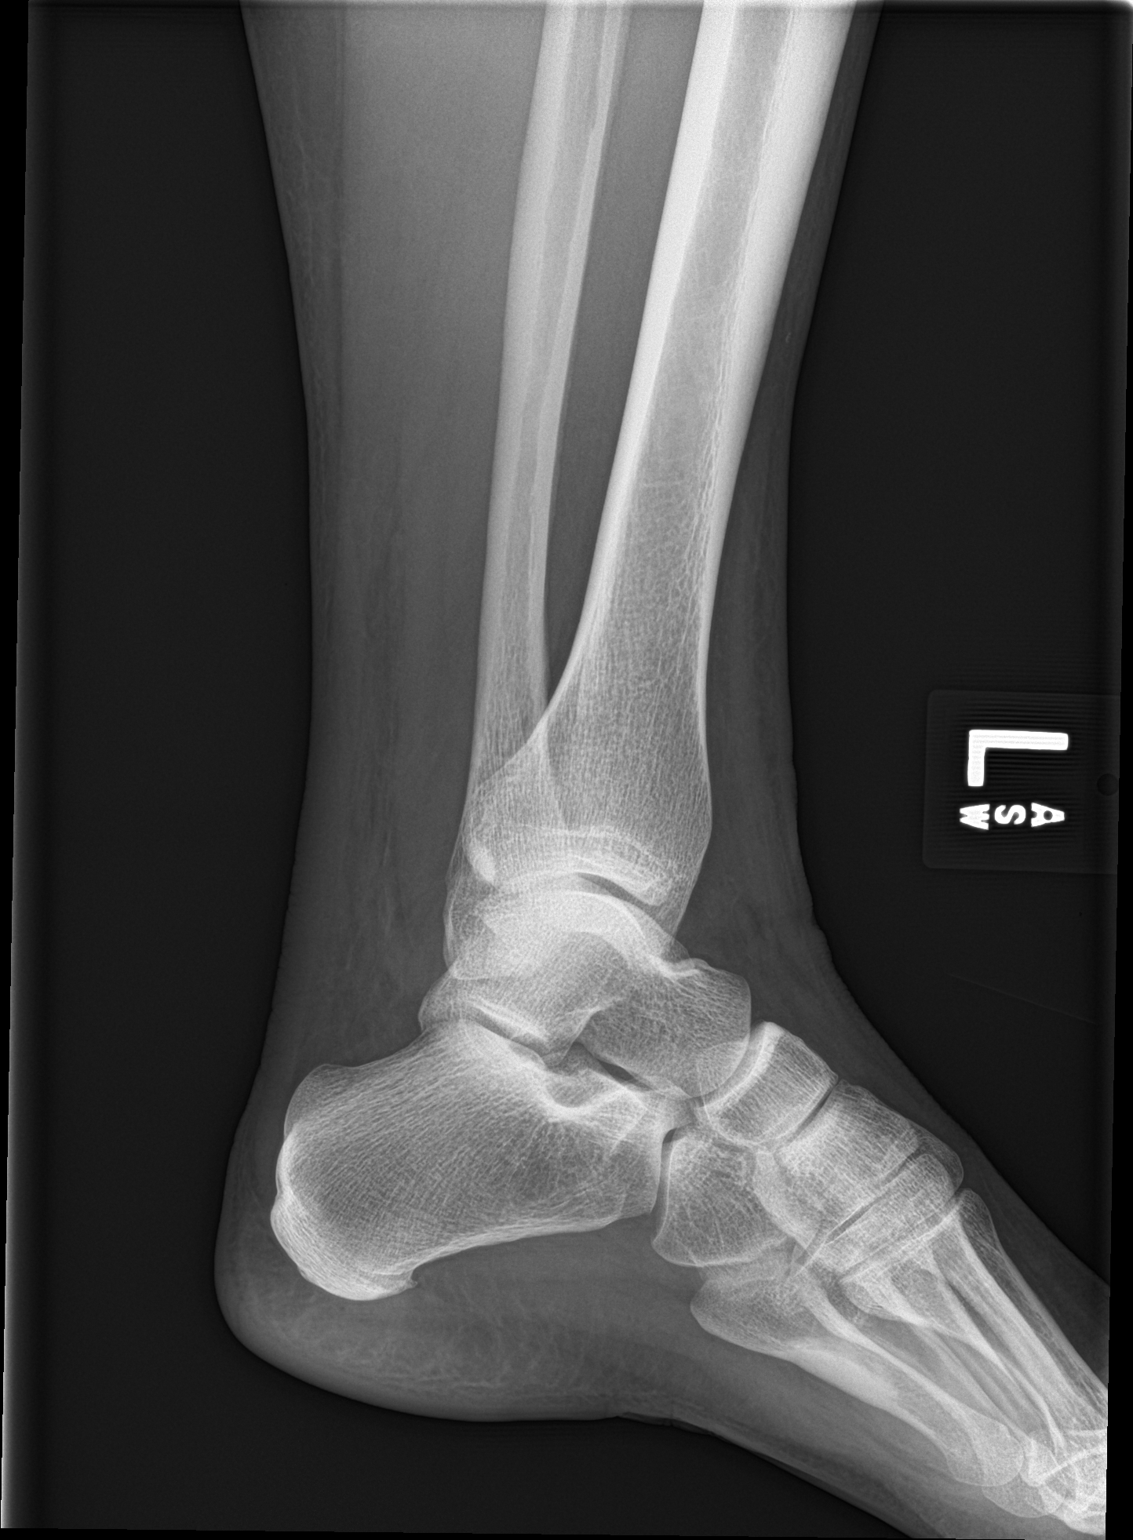

[3 of 3 positions shown; findings below may reference images not displayed]

FINDINGS: Lateral soft tissue swelling. No acute bony abnormality.
Specifically, no fracture, subluxation, or dislocation.
IMPRESSION: No acute bony abnormality.

## 2021-08-08 DIAGNOSIS — I10 Essential (primary) hypertension: Secondary | ICD-10-CM | POA: Diagnosis not present

## 2021-08-08 DIAGNOSIS — G43909 Migraine, unspecified, not intractable, without status migrainosus: Secondary | ICD-10-CM | POA: Diagnosis not present

## 2021-08-08 DIAGNOSIS — Z23 Encounter for immunization: Secondary | ICD-10-CM | POA: Diagnosis not present

## 2021-08-08 DIAGNOSIS — R399 Unspecified symptoms and signs involving the genitourinary system: Secondary | ICD-10-CM | POA: Diagnosis not present

## 2021-09-15 DIAGNOSIS — Z7289 Other problems related to lifestyle: Secondary | ICD-10-CM | POA: Diagnosis not present

## 2021-09-15 DIAGNOSIS — G43909 Migraine, unspecified, not intractable, without status migrainosus: Secondary | ICD-10-CM | POA: Diagnosis not present

## 2021-09-15 DIAGNOSIS — I10 Essential (primary) hypertension: Secondary | ICD-10-CM | POA: Diagnosis not present

## 2021-09-15 DIAGNOSIS — Z79899 Other long term (current) drug therapy: Secondary | ICD-10-CM | POA: Diagnosis not present

## 2021-09-15 DIAGNOSIS — K5732 Diverticulitis of large intestine without perforation or abscess without bleeding: Secondary | ICD-10-CM | POA: Diagnosis not present

## 2021-09-15 DIAGNOSIS — R1032 Left lower quadrant pain: Secondary | ICD-10-CM | POA: Diagnosis not present

## 2021-09-15 DIAGNOSIS — R109 Unspecified abdominal pain: Secondary | ICD-10-CM | POA: Diagnosis not present

## 2021-12-04 DIAGNOSIS — J0100 Acute maxillary sinusitis, unspecified: Secondary | ICD-10-CM | POA: Diagnosis not present

## 2022-02-05 DIAGNOSIS — R399 Unspecified symptoms and signs involving the genitourinary system: Secondary | ICD-10-CM | POA: Diagnosis not present

## 2022-02-05 DIAGNOSIS — Z0000 Encounter for general adult medical examination without abnormal findings: Secondary | ICD-10-CM | POA: Diagnosis not present

## 2022-02-05 DIAGNOSIS — Z1231 Encounter for screening mammogram for malignant neoplasm of breast: Secondary | ICD-10-CM | POA: Diagnosis not present

## 2022-02-05 DIAGNOSIS — I10 Essential (primary) hypertension: Secondary | ICD-10-CM | POA: Diagnosis not present

## 2022-02-05 DIAGNOSIS — N343 Urethral syndrome, unspecified: Secondary | ICD-10-CM | POA: Diagnosis not present

## 2022-02-05 DIAGNOSIS — Z1330 Encounter for screening examination for mental health and behavioral disorders, unspecified: Secondary | ICD-10-CM | POA: Diagnosis not present

## 2022-02-05 DIAGNOSIS — Z1331 Encounter for screening for depression: Secondary | ICD-10-CM | POA: Diagnosis not present

## 2022-03-22 ENCOUNTER — Ambulatory Visit
Admission: EM | Admit: 2022-03-22 | Discharge: 2022-03-22 | Disposition: A | Discharge status: Home / Self Care | Payer: 59 | Attending: Physician Assistant | Admitting: Physician Assistant

## 2022-03-22 ENCOUNTER — Other Ambulatory Visit: Payer: Self-pay

## 2022-03-22 DIAGNOSIS — J358 Other chronic diseases of tonsils and adenoids: Secondary | ICD-10-CM | POA: Diagnosis not present

## 2022-03-22 DIAGNOSIS — B349 Viral infection, unspecified: Secondary | ICD-10-CM | POA: Insufficient documentation

## 2022-03-22 DIAGNOSIS — J029 Acute pharyngitis, unspecified: Secondary | ICD-10-CM | POA: Insufficient documentation

## 2022-03-22 DIAGNOSIS — J309 Allergic rhinitis, unspecified: Secondary | ICD-10-CM | POA: Insufficient documentation

## 2022-03-22 LAB — GROUP A STREP BY PCR: Group A Strep by PCR: NOT DETECTED

## 2022-03-22 MED ORDER — LIDOCAINE VISCOUS HCL 2 % MT SOLN: 15.0000 mL | OROMUCOSAL | 0 refills | Status: DC | PRN

## 2022-03-22 NOTE — Discharge Instructions (Signed)
-  The strep test was negative. - You have canker sores or aphthous ulcers on your tonsils which are generally associated with viral illnesses.  It should resolve over the next week or so.  I have sent viscous lidocaine to the pharmacy to help numb the area.  You can also use Chloraseptic spray and take ibuprofen and/or Tylenol for discomfort.  Make sure to stay hydrated. - Your allergies are also probably playing a role so would add Sudafed PE to the Allegra that you are taking. - Follow-up with [place] as needed.

## 2022-03-22 NOTE — ED Triage Notes (Signed)
Patient presents to Urgent Care with complaints of sore throat, right ear pain since 3 days ago. Concerned with strep. Treating symptoms with allegra and flonase.   Denies fever.

## 2022-03-22 NOTE — ED Provider Notes (Signed)
[facility]    CSN: [id number] Arrival date & time: 03/22/22  1008      History   Chief Complaint Chief Complaint  Patient presents with   Sore Throat    HPI Alyssa Valencia is a 51 y.o. female presenting for 3-day history of sore throat, right-sided ear pain.  Patient has history of allergies and has been outside more than normal the past several days.  She reports nasal congestion and drainage as well as postnasal drainage and sinus pressure.  Patient unsure if the sore throat is related to allergies or possibly something else.  She has noticed that she has swollen lymph nodes and states that there is something on her tonsils.  No associated fevers.  No sick contacts.  Has been taking Allegra for symptoms.  No other complaints.  HPI  Past Medical History:  Diagnosis Date   GERD (gastroesophageal reflux disease)    Hypertension    Patient denies medical problems    Seasonal allergies    UTI (lower urinary tract infection)     Patient Active Problem List   Diagnosis Date Noted   Allergy 09/15/2018   Migraine headache 09/15/2018   Recurrent urinary tract infection 09/15/2018   Encounter for routine gynecological examination 03/13/2014   Positive H. pylori test 06/19/2013   Microscopic hematuria 06/09/2012    Past Surgical History:  Procedure Laterality Date   KNEE SURGERY Left    MOUTH SURGERY      OB History     Gravida  1   Para  0   Term      Preterm      AB  1   Living         SAB      IAB      Ectopic      Multiple      Live Births               Home Medications    Prior to Admission medications   Medication Sig Start Date End Date Taking? Authorizing Provider  albuterol (VENTOLIN HFA) 108 (90 Base) MCG/ACT inhaler SMARTSIG:2 Inhalation Via Inhaler Every 4 Hours PRN 07/18/20   [provider]  famotidine (PEPCID) 40 MG tablet Take 40 mg by mouth daily.    [provider]  fexofenadine (ALLEGRA) 180 MG  tablet Take 180 mg by mouth daily.    [provider]  levonorgestrel-ethinyl estradiol (SEASONALE,INTROVALE,JOLESSA) 0.15-0.03 MG tablet Take 1 tablet by mouth daily.    [provider]  losartan (COZAAR) 100 MG tablet Take 100 mg by mouth daily. 09/09/19   [provider]  meloxicam (MOBIC) 15 MG tablet Take 1 tablet (15 mg total) by mouth daily as needed for pain. 04/05/21   Cook, Jayce G, DO  mometasone (NASONEX) 50 MCG/ACT nasal spray Place 2 sprays into the nose daily. 04/25/21   Domenick Gong, MD  phenazopyridine (PYRIDIUM) 200 MG tablet Take 1 tablet (200 mg total) by mouth 3 (three) times daily. 05/17/21   Shirlee Latch, PA-C    Family History Family History  Problem Relation Age of Onset   Irritable bowel syndrome Mother    Nephrolithiasis Mother    Dementia Mother    Alzheimer's disease Mother    Heart attack Father     Social History Social History   Tobacco Use   Smoking status: Former    Types: Cigarettes    Quit date: 11/04/1985    Years since quitting: 57.4  Smokeless tobacco: Never  Vaping Use   Vaping Use: Never used  Substance Use Topics   Alcohol use: Yes    Alcohol/week: 3.0 standard drinks    Types: 3 Glasses of wine per week   Drug use: No     Allergies   Penicillins, Amoxicillin, and Shellfish allergy   Review of Systems Review of Systems  Constitutional:  Negative for chills, diaphoresis, fatigue and fever.  HENT:  Positive for congestion, ear pain (Left), rhinorrhea, sinus pressure and sore throat. Negative for sinus pain.   Respiratory:  Negative for cough and shortness of breath.   Gastrointestinal:  Negative for abdominal pain, nausea and vomiting.  Musculoskeletal:  Negative for arthralgias and myalgias.  Skin:  Negative for rash.  Neurological:  Negative for weakness and headaches.  Hematological:  Positive for adenopathy.    Physical Exam Triage Vital Signs ED Triage Vitals  Enc Vitals Group     BP       Pulse      Resp      Temp      Temp src      SpO2      Weight      Height      Head Circumference      Peak Flow      Pain Score      Pain Loc      Pain Edu?      Excl. in GC?    No data found.  Updated Vital Signs BP (!) 144/96 (BP Location: Left Arm)   Pulse 75   Temp 98.2 F (36.8 C) (Oral)   Resp 18   SpO2 100%      Physical Exam Vitals and nursing note reviewed.  Constitutional:      General: She is not in acute distress.    Appearance: Normal appearance. She is well-developed. She is not ill-appearing or toxic-appearing.  HENT:     Head: Normocephalic and atraumatic.     Right Ear: A middle ear effusion is present.     Left Ear: A middle ear effusion is present.     Nose: Congestion present.     Mouth/Throat:     Mouth: Mucous membranes are moist. Oral lesions (small ulcerations with grayish base on bilateral tonsils R>L) present.     Pharynx: Oropharynx is clear. Posterior oropharyngeal erythema present.     Tonsils: 1+ on the right. 1+ on the left.  Eyes:     General: No scleral icterus.       Right eye: No discharge.        Left eye: No discharge.     Conjunctiva/sclera: Conjunctivae normal.  Cardiovascular:     Rate and Rhythm: Normal rate and regular rhythm.     Heart sounds: Normal heart sounds.  Pulmonary:     Effort: Pulmonary effort is normal. No respiratory distress.     Breath sounds: Normal breath sounds.  Musculoskeletal:     Cervical back: Neck supple.  Lymphadenopathy:     Cervical: Cervical adenopathy present.  Skin:    General: Skin is dry.  Neurological:     General: No focal deficit present.     Mental Status: She is alert. Mental status is at baseline.     Motor: No weakness.     Gait: Gait normal.  Psychiatric:        Mood and Affect: Mood normal.        Behavior: Behavior normal.  Thought Content: Thought content normal.     UC Treatments / Results  Labs (all labs ordered are listed, but only abnormal results  are displayed) Labs Reviewed  GROUP A STREP BY PCR    EKG   Radiology No results found.  Procedures Procedures (including critical care time)  Medications Ordered in UC Medications - No data to display  Initial Impression / Assessment and Plan / UC Course  I have reviewed the triage vital signs and the nursing notes.  Pertinent labs & imaging results that were available during my care of the patient were reviewed by me and considered in my medical decision making (see chart for details).  51 year old female presenting for sore throat and right sided ear pain for the past couple of days.  History of allergies and has had nasal congestion and postnasal drainage more than normal related to her allergies recently.  Vitals are stable.  She is afebrile and overall well-appearing.  She does have erythema posterior pharynx and tonsils with 1+ bilateral enlarged tonsils and small ulcerations with a grayish base consistent with aphthous ulcers on tonsils, right greater than left.  Tender and enlarged bilateral anterior cervical lymph nodes.  Nasal congestion also present.  Chest clear to auscultation.  PCR strep test is negative.  Discussed results with her.  Advised her she likely has a viral illness in addition to her seasonal type allergies.  I have sent viscous lidocaine and also advised she can do Chloraseptic spray, ibuprofen and Tylenol.  Advised to add Sudafed PE help with her sinus symptoms and advised the ear discomfort is likely related to the effusion.  Patient does not like to do nasal sprays because it burns her nose.  Advised plenty of rest and fluids.  Reviewed return and ER precautions.   Final Clinical Impressions(s) / UC Diagnoses   Final diagnoses:  Sore throat  Aphthous ulcer of tonsil  Allergic rhinitis, unspecified seasonality, unspecified trigger     Discharge Instructions      -The strep test was negative. - You have canker sores or aphthous ulcers on your tonsils  which are generally associated with viral illnesses.  It should resolve over the next week or so.  I have sent viscous lidocaine to the pharmacy to help numb the area.  You can also use Chloraseptic spray and take ibuprofen and/or Tylenol for discomfort.  Make sure to stay hydrated. - Your allergies are also probably playing a role so would add Sudafed PE to the Allegra that you are taking. - Follow-up with [place] as needed.     ED Prescriptions   None    PDMP not reviewed this encounter.   Shirlee Latch, PA-C 03/22/22 1148

## 2022-03-29 DIAGNOSIS — Z01419 Encounter for gynecological examination (general) (routine) without abnormal findings: Secondary | ICD-10-CM | POA: Diagnosis not present

## 2022-03-29 DIAGNOSIS — Z3041 Encounter for surveillance of contraceptive pills: Secondary | ICD-10-CM | POA: Diagnosis not present

## 2022-03-29 DIAGNOSIS — N951 Menopausal and female climacteric states: Secondary | ICD-10-CM | POA: Diagnosis not present

## 2022-03-29 DIAGNOSIS — Z1231 Encounter for screening mammogram for malignant neoplasm of breast: Secondary | ICD-10-CM | POA: Diagnosis not present

## 2022-06-19 DIAGNOSIS — Z3041 Encounter for surveillance of contraceptive pills: Secondary | ICD-10-CM | POA: Diagnosis not present

## 2022-06-21 ENCOUNTER — Ambulatory Visit
Admission: EM | Admit: 2022-06-21 | Discharge: 2022-06-21 | Disposition: A | Discharge status: Home / Self Care | Payer: 59 | Attending: Family Medicine | Admitting: Family Medicine

## 2022-06-21 ENCOUNTER — Encounter: Payer: Self-pay | Admitting: Emergency Medicine

## 2022-06-21 DIAGNOSIS — J019 Acute sinusitis, unspecified: Secondary | ICD-10-CM | POA: Insufficient documentation

## 2022-06-21 DIAGNOSIS — R3129 Other microscopic hematuria: Secondary | ICD-10-CM | POA: Diagnosis not present

## 2022-06-21 DIAGNOSIS — M545 Low back pain, unspecified: Secondary | ICD-10-CM | POA: Insufficient documentation

## 2022-06-21 LAB — URINALYSIS, ROUTINE W REFLEX MICROSCOPIC
Bilirubin Urine: NEGATIVE
Glucose, UA: NEGATIVE mg/dL
Ketones, ur: NEGATIVE mg/dL
Leukocytes,Ua: NEGATIVE
Nitrite: NEGATIVE
Protein, ur: NEGATIVE mg/dL
Specific Gravity, Urine: 1.02 (ref 1.005–1.030)
pH: 5.5 (ref 5.0–8.0)

## 2022-06-21 LAB — URINALYSIS, MICROSCOPIC (REFLEX)

## 2022-06-21 MED ORDER — FLUCONAZOLE 150 MG PO TABS: 150.0000 mg | ORAL_TABLET | ORAL | 0 refills | Status: AC

## 2022-06-21 MED ORDER — CEFDINIR 300 MG PO CAPS: 300.0000 mg | ORAL_CAPSULE | Freq: Two times a day (BID) | ORAL | 0 refills | Status: AC

## 2022-06-21 NOTE — Discharge Instructions (Addendum)
Stop by the pharmacy to pick up your prescriptions.  Be sure to take all of your antibiotics.  If you start having increasing pain or symptoms or not improving please follow-up here at the urgent care or with your primary care doctor.  You did not show evidence of a urinary tract infection today.  You still have blood in your urine but you are following with the gynecologist.

## 2022-06-21 NOTE — ED Triage Notes (Signed)
Pt reports bilateral ear pain, nasal congestion, sinus pressure x 1.5 weeks. States right ear is worse than left. States when bending over she feels like there is fluid in the right ear. Also endorses intermittent headache . Denies fever. Has been using Flonase and Allegra.   Also endorses sporadic left lower back pain x 3 days, and mild urinary frequency.

## 2022-06-21 NOTE — ED Provider Notes (Signed)
[facility]    CSN: [id number] Arrival date & time: 06/21/22  0955      History   Chief Complaint Chief Complaint  Patient presents with   Nasal Congestion   Otalgia   Sinus Pressure    HPI Alyssa Valencia is a 51 y.o. female.   HPI   [name] reports for the past week and a half she has been experiencing nasal congestion that is worse on her right side.  She has tried hot showers, using a Environmental health practitioner, nasal sprays like Flonase without much relief.  She does note that Flonase helps some but the symptoms keep coming back.  This morning, she woke up with sinus pressure and headache which is unlike her migraines.  She endorses ear pain, sinus pressure.  Says she heard "an ocean" in her ear when leaning over this morning.  She does not use sinus rinses or Nettie Potts.  A couple days ago she started having postnasal drip.  She took 2 COVID test that were both negative.  Adds that she has history of sinus infections and this feels similar.   She is experiencing a continuous pain on her left side. Worse when she turns to the left side. No personal history of stone but her parents had them.  Symptoms started about 3-4 days ago.  This morning, pain was a little better.  She is prone to urinary tract infections. She has some urinary frequency and urgency. A week ago she has some burning. She took Tylenol and back and body for chronic neck pain.   No visible blood in urine but always has blood in her urine when she goes to the doctor. Sees a gynecologist.    Fever : no  Chills: no Sore throat: no   Cough: no Nasal congestion : yes Rhinorrhea: no Myalgias: no Appetite: normal  Hydration: normal  Nausea: no Vomiting: no Sleep disturbance: yes Headache: yes   Past Medical History:  Diagnosis Date   GERD (gastroesophageal reflux disease)    Hypertension    Patient denies medical problems    Seasonal allergies    UTI (lower urinary tract infection)     Patient Active Problem  List   Diagnosis Date Noted   Allergy 09/15/2018   Migraine headache 09/15/2018   Recurrent urinary tract infection 09/15/2018   Encounter for routine gynecological examination 03/13/2014   Positive H. pylori test 06/19/2013   Microscopic hematuria 06/09/2012    Past Surgical History:  Procedure Laterality Date   KNEE SURGERY Left    MOUTH SURGERY      OB History     Gravida  1   Para  0   Term      Preterm      AB  1   Living         SAB      IAB      Ectopic      Multiple      Live Births               Home Medications    Prior to Admission medications   Medication Sig Start Date End Date Taking? Authorizing Provider  cefdinir (OMNICEF) 300 MG capsule Take 1 capsule (300 mg total) by mouth 2 (two) times daily for 10 days. 06/21/22 07/01/22 Yes Benjamin Merrihew, DO  fluconazole (DIFLUCAN) 150 MG tablet Take 1 tablet (150 mg total) by mouth every 3 (three) days for 2 doses. 06/28/22 07/02/22 Yes Raynisha Avilla, Seward Meth, DO  albuterol (VENTOLIN HFA) 108 (90 Base) MCG/ACT inhaler SMARTSIG:2 Inhalation Via Inhaler Every 4 Hours PRN 07/18/20   [provider]  famotidine (PEPCID) 40 MG tablet Take 40 mg by mouth daily.    [provider]  fexofenadine (ALLEGRA) 180 MG tablet Take 180 mg by mouth daily.    [provider]  levonorgestrel-ethinyl estradiol (SEASONALE,INTROVALE,JOLESSA) 0.15-0.03 MG tablet Take 1 tablet by mouth daily.    [provider]  lidocaine (XYLOCAINE) 2 % solution Use as directed 15 mLs in the mouth or throat every 3 (three) hours as needed for mouth pain (swish and spit). 03/22/22   Shirlee Latch, PA-C  losartan (COZAAR) 100 MG tablet Take 100 mg by mouth daily. 09/09/19   [provider]  meloxicam (MOBIC) 15 MG tablet Take 1 tablet (15 mg total) by mouth daily as needed for pain. 04/05/21   Cook, Jayce G, DO  mometasone (NASONEX) 50 MCG/ACT nasal spray Place 2 sprays into the nose daily. 04/25/21    Domenick Gong, MD  phenazopyridine (PYRIDIUM) 200 MG tablet Take 1 tablet (200 mg total) by mouth 3 (three) times daily. 05/17/21   Shirlee Latch, PA-C    Family History Family History  Problem Relation Age of Onset   Irritable bowel syndrome Mother    Nephrolithiasis Mother    Dementia Mother    Alzheimer's disease Mother    Heart attack Father     Social History Social History   Tobacco Use   Smoking status: Former    Types: Cigarettes    Quit date: 11/04/1985    Years since quitting: 36.6   Smokeless tobacco: Never  Vaping Use   Vaping Use: Never used  Substance Use Topics   Alcohol use: Yes    Alcohol/week: 3.0 standard drinks of alcohol    Types: 3 Glasses of wine per week   Drug use: No     Allergies   Penicillins, Amoxicillin, and Shellfish allergy   Review of Systems Review of Systems: :negative unless otherwise stated in HPI.      Physical Exam Triage Vital Signs ED Triage Vitals  Enc Vitals Group     BP 06/21/22 1017 (!) 126/98     Pulse Rate 06/21/22 1017 77     Resp 06/21/22 1017 18     Temp 06/21/22 1017 98.2 F (36.8 C)     Temp Source 06/21/22 1017 Oral     SpO2 06/21/22 1017 100 %     Weight --      Height --      Head Circumference --      Peak Flow --      Pain Score 06/21/22 1015 9     Pain Loc --      Pain Edu? --      Excl. in GC? --    No data found.  Updated Vital Signs BP (!) 126/98 (BP Location: Right Arm)   Pulse 77   Temp 98.2 F (36.8 C) (Oral)   Resp 18   SpO2 100%   Visual Acuity Right Eye Distance:   Left Eye Distance:   Bilateral Distance:    Right Eye Near:   Left Eye Near:    Bilateral Near:     Physical Exam GEN:     alert, non-ill appearing female in no distress    HENT:  mucus membranes moist, oropharyngeal without lesions or exudate, no tonsillar hypertrophy, mild erythema, moderate turbinate hypertrophy, clear nasal discharge, bilateral TM normal  EYES:   pupils equal and reactive, no  scleral injection NECK:  normal ROM, no lymphadenopathy, no meningismus   RESP:  clear to auscultation bilaterally, no increased work of breathing  CVS:   regular rate and rhythm ABD: soft, non-tender, no CVA tenderness  MSK: no C, T or L midline or paraspinal tenderness, normal range of motion to flexion, extension, rotation, strength 5/5 bilateral lower extremities, antalgic gait Skin:   warm and dry    UC Treatments / Results  Labs (all labs ordered are listed, but only abnormal results are displayed) Labs Reviewed  URINALYSIS, ROUTINE W REFLEX MICROSCOPIC - Abnormal; Notable for the following components:      Result Value   APPearance HAZY (*)    Hgb urine dipstick MODERATE (*)    All other components within normal limits  URINALYSIS, MICROSCOPIC (REFLEX) - Abnormal; Notable for the following components:   Bacteria, UA MANY (*)    All other components within normal limits    EKG   Radiology No results found.  Procedures Procedures (including critical care time)  Medications Ordered in UC Medications - No data to display  Initial Impression / Assessment and Plan / UC Course  I have reviewed the triage vital signs and the nursing notes.  Pertinent labs & imaging results that were available during my care of the patient were reviewed by me and considered in my medical decision making (see chart for details).     Patient is a 51 year old female who presents with a week and a half of nasal congestion and sinus pressure.  On chart review, patient has had several bouts of sinusitis. Overall pt is well appearing, well hydrated, without respiratory distress.  She is afebrile here.  On exam, she has ethmoid maxillary and frontal sinus tenderness.  I suspect she has a recurrence of her sinusitis.  Treat with cefdinir twice daily for 10 days.  Patient is prone to antibiotic associated yeast infections.  Treat prophylactically with Diflucan.  Left-sided [place] back pain likely due to  muscle strain.  Urinalysis was unremarkable for for acute cystitis.  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  She follows with gynecology.  Reviewed recent office notes by Karena Addison, NP.  Continue over-the-counter analgesics as these are helping.  ED and return precautions provided and patient voiced understanding.  Discussed MDM, treatment plan and plan for follow-up with patient/parent who agrees with plan.     Final Clinical Impressions(s) / UC Diagnoses   Final diagnoses:  Acute sinusitis, recurrence not specified, unspecified location  Acute left-sided [place] back pain without sciatica  Microscopic hematuria     Discharge Instructions      Stop by the pharmacy to pick up your prescriptions.  Be sure to take all of your antibiotics.  If you start having increasing pain or symptoms or not improving please follow-up here at the urgent care or with your primary care doctor.  You did not show evidence of a urinary tract infection today.  You still have blood in your urine but you are following with the gynecologist.     ED Prescriptions     Medication Sig Dispense Auth. Provider   cefdinir (OMNICEF) 300 MG capsule Take 1 capsule (300 mg total) by mouth 2 (two) times daily for 10 days. 20 capsule Magdelene Ruark, DO   fluconazole (DIFLUCAN) 150 MG tablet Take 1 tablet (150 mg total) by mouth every 3 (three) days for 2 doses. 2 tablet Katha Cabal, DO  PDMP not reviewed this encounter.   Katha Cabal, DO 06/21/22 1646

## 2022-07-10 ENCOUNTER — Encounter (HOSPITAL_COMMUNITY): Payer: Self-pay | Admitting: Emergency Medicine

## 2022-07-10 ENCOUNTER — Ambulatory Visit (HOSPITAL_COMMUNITY)
Admission: EM | Admit: 2022-07-10 | Discharge: 2022-07-10 | Disposition: A | Discharge status: Home / Self Care | Payer: 59 | Attending: Family Medicine | Admitting: Family Medicine

## 2022-07-10 DIAGNOSIS — J020 Streptococcal pharyngitis: Secondary | ICD-10-CM

## 2022-07-10 LAB — POCT RAPID STREP A, ED / UC: Streptococcus, Group A Screen (Direct): POSITIVE — AB

## 2022-07-10 MED ORDER — IBUPROFEN 800 MG PO TABS: 800.0000 mg | ORAL_TABLET | Freq: Three times a day (TID) | ORAL | 0 refills | Status: DC | PRN

## 2022-07-10 MED ORDER — CEPHALEXIN 500 MG PO CAPS: 500.0000 mg | ORAL_CAPSULE | Freq: Two times a day (BID) | ORAL | 0 refills | Status: AC

## 2022-07-10 MED ORDER — FLUCONAZOLE 150 MG PO TABS: 150.0000 mg | ORAL_TABLET | ORAL | 0 refills | Status: AC

## 2022-07-10 NOTE — ED Provider Notes (Signed)
[facility]    CSN: [id number] Arrival date & time: 07/10/22  0940      History   Chief Complaint Chief Complaint  Patient presents with   Sore Throat   Headache    HPI Alyssa Valencia is a 51 y.o. female.    Sore Throat Associated symptoms include headaches.  Headache  Here for history of sore throat that began on September 2.  It worsened the next day, and she had very mild nasal congestion with it.  Not really much cough.  No fever or chills and no vomiting  She just recently finished a course of cefdinir for an acute sinusitis.  She states that medication gave her some diarrhea.  She does tend to get yeast infections when she takes antibiotics  Past Medical History:  Diagnosis Date   GERD (gastroesophageal reflux disease)    Hypertension    Patient denies medical problems    Seasonal allergies    UTI (lower urinary tract infection)     Patient Active Problem List   Diagnosis Date Noted   Allergy 09/15/2018   Migraine headache 09/15/2018   Recurrent urinary tract infection 09/15/2018   Encounter for routine gynecological examination 03/13/2014   Positive H. pylori test 06/19/2013   Microscopic hematuria 06/09/2012    Past Surgical History:  Procedure Laterality Date   KNEE SURGERY Left    MOUTH SURGERY      OB History     Gravida  1   Para  0   Term      Preterm      AB  1   Living         SAB      IAB      Ectopic      Multiple      Live Births               Home Medications    Prior to Admission medications   Medication Sig Start Date End Date Taking? Authorizing Provider  cephALEXin (KEFLEX) 500 MG capsule Take 1 capsule (500 mg total) by mouth 2 (two) times daily for 10 days. 07/10/22 07/20/22 Yes Jakaiden Fill, Janace Aris, MD  fluconazole (DIFLUCAN) 150 MG tablet Take 1 tablet (150 mg total) by mouth every 3 (three) days for 2 doses. 07/10/22 07/14/22 Yes Zenia Resides, MD  ibuprofen (ADVIL) 800 MG tablet Take 1 tablet  (800 mg total) by mouth every 8 (eight) hours as needed (pain). 07/10/22  Yes Zenia Resides, MD  albuterol (VENTOLIN HFA) 108 (90 Base) MCG/ACT inhaler SMARTSIG:2 Inhalation Via Inhaler Every 4 Hours PRN 07/18/20   [provider]  famotidine (PEPCID) 40 MG tablet Take 40 mg by mouth daily.    [provider]  fexofenadine (ALLEGRA) 180 MG tablet Take 180 mg by mouth daily.    [provider]  levonorgestrel-ethinyl estradiol (SEASONALE,INTROVALE,JOLESSA) 0.15-0.03 MG tablet Take 1 tablet by mouth daily.    [provider]  losartan (COZAAR) 100 MG tablet Take 100 mg by mouth daily. 09/09/19   [provider]  meloxicam (MOBIC) 15 MG tablet Take 1 tablet (15 mg total) by mouth daily as needed for pain. 04/05/21   Cook, Jayce G, DO  mometasone (NASONEX) 50 MCG/ACT nasal spray Place 2 sprays into the nose daily. 04/25/21   Domenick Gong, MD    Family History Family History  Problem Relation Age of Onset   Irritable bowel syndrome Mother    Nephrolithiasis Mother  Dementia Mother    Alzheimer's disease Mother    Heart attack Father     Social History Social History   Tobacco Use   Smoking status: Former    Types: Cigarettes    Quit date: 11/04/1985    Years since quitting: 36.7   Smokeless tobacco: Never  Vaping Use   Vaping Use: Never used  Substance Use Topics   Alcohol use: Yes    Alcohol/week: 3.0 standard drinks of alcohol    Types: 3 Glasses of wine per week   Drug use: No     Allergies   Penicillins, Amoxicillin, and Shellfish allergy   Review of Systems Review of Systems  Neurological:  Positive for headaches.     Physical Exam Triage Vital Signs ED Triage Vitals  Enc Vitals Group     BP 07/10/22 1051 128/84     Pulse Rate 07/10/22 1051 68     Resp 07/10/22 1051 16     Temp 07/10/22 1051 98.2 F (36.8 C)     Temp Source 07/10/22 1051 Oral     SpO2 07/10/22 1051 96 %     Weight --      Height --       Head Circumference --      Peak Flow --      Pain Score 07/10/22 1050 8     Pain Loc --      Pain Edu? --      Excl. in GC? --    No data found.  Updated Vital Signs BP 128/84 (BP Location: Left Arm)   Pulse 68   Temp 98.2 F (36.8 C) (Oral)   Resp 16   SpO2 96%   Visual Acuity Right Eye Distance:   Left Eye Distance:   Bilateral Distance:    Right Eye Near:   Left Eye Near:    Bilateral Near:     Physical Exam Vitals reviewed.  Constitutional:      General: She is not in acute distress.    Appearance: She is not toxic-appearing.  HENT:     Right Ear: Tympanic membrane and ear canal normal.     Left Ear: Tympanic membrane and ear canal normal.     Nose: Nose normal.     Mouth/Throat:     Mouth: Mucous membranes are moist.     Comments: There is erythema of the tonsils bilaterally with hypertrophy about 2+.  There is clear mucus in the tonsillar crypts.  There is no asymmetry Eyes:     Extraocular Movements: Extraocular movements intact.     Conjunctiva/sclera: Conjunctivae normal.     Pupils: Pupils are equal, round, and reactive to light.  Neck:     Comments: There is some tender cervical adenopathy on the left. Cardiovascular:     Rate and Rhythm: Normal rate and regular rhythm.     Heart sounds: No murmur heard. Pulmonary:     Effort: Pulmonary effort is normal. No respiratory distress.     Breath sounds: No stridor. No wheezing, rhonchi or rales.  Musculoskeletal:     Cervical back: Neck supple.  Skin:    Capillary Refill: Capillary refill takes less than 2 seconds.     Coloration: Skin is not jaundiced or pale.  Neurological:     General: No focal deficit present.     Mental Status: She is alert and oriented to person, place, and time.  Psychiatric:        Behavior: Behavior normal.  UC Treatments / Results  Labs (all labs ordered are listed, but only abnormal results are displayed) Labs Reviewed  POCT RAPID STREP A, ED / UC - Abnormal;  Notable for the following components:      Result Value   Streptococcus, Group A Screen (Direct) POSITIVE (*)    All other components within normal limits    EKG   Radiology No results found.  Procedures Procedures (including critical care time)  Medications Ordered in UC Medications - No data to display  Initial Impression / Assessment and Plan / UC Course  I have reviewed the triage vital signs and the nursing notes.  Pertinent labs & imaging results that were available during my care of the patient were reviewed by me and considered in my medical decision making (see chart for details).     Rapid strep test is positive so we will treat with cephalexin.  Also I will send in Diflucan in case she needs it for yeast infection Final Clinical Impressions(s) / UC Diagnoses   Final diagnoses:  Strep pharyngitis     Discharge Instructions      Strep test is positive 2 days into taking the antibiotics, throw away the toothbrush and begin using a new one. Patient is not contagious after 24 hours of antibiotics; a full 10 days should be completed to prevent rheumatic fever  Take cephalexin 500 mg--1 capsule 2 times daily for 10 days  Take ibuprofen 800 mg--1 tab every 8 hours as needed for pain.  If you develop yeast symptoms take fluconazole 150 mg--1 tablet every 3 days for 2 doses      ED Prescriptions     Medication Sig Dispense Auth. Provider   cephALEXin (KEFLEX) 500 MG capsule Take 1 capsule (500 mg total) by mouth 2 (two) times daily for 10 days. 20 capsule Zenia Resides, MD   fluconazole (DIFLUCAN) 150 MG tablet Take 1 tablet (150 mg total) by mouth every 3 (three) days for 2 doses. 2 tablet Zenia Resides, MD   ibuprofen (ADVIL) 800 MG tablet Take 1 tablet (800 mg total) by mouth every 8 (eight) hours as needed (pain). 21 tablet Ariston Grandison, Janace Aris, MD      PDMP not reviewed this encounter.   Zenia Resides, MD 07/10/22 [id number]

## 2022-07-10 NOTE — ED Triage Notes (Signed)
Pt reports a sore throat beginning on Sunday, and around 4 am this am noticed "white stuff" in the back of her throat. Also reports a headache beginning on Saturday. Denies fever. States she was also recently treated for a sinus infection.

## 2022-07-10 NOTE — Discharge Instructions (Addendum)
Strep test is positive 2 days into taking the antibiotics, throw away the toothbrush and begin using a new one. Patient is not contagious after 24 hours of antibiotics; a full 10 days should be completed to prevent rheumatic fever  Take cephalexin 500 mg--1 capsule 2 times daily for 10 days  Take ibuprofen 800 mg--1 tab every 8 hours as needed for pain.  If you develop yeast symptoms take fluconazole 150 mg--1 tablet every 3 days for 2 doses

## 2022-07-16 DIAGNOSIS — Z1231 Encounter for screening mammogram for malignant neoplasm of breast: Secondary | ICD-10-CM | POA: Diagnosis not present

## 2022-08-13 DIAGNOSIS — Z8262 Family history of osteoporosis: Secondary | ICD-10-CM | POA: Diagnosis not present

## 2022-08-13 DIAGNOSIS — G43009 Migraine without aura, not intractable, without status migrainosus: Secondary | ICD-10-CM | POA: Diagnosis not present

## 2022-08-13 DIAGNOSIS — I10 Essential (primary) hypertension: Secondary | ICD-10-CM | POA: Diagnosis not present

## 2022-08-13 DIAGNOSIS — M542 Cervicalgia: Secondary | ICD-10-CM | POA: Diagnosis not present

## 2022-08-13 DIAGNOSIS — M47812 Spondylosis without myelopathy or radiculopathy, cervical region: Secondary | ICD-10-CM | POA: Diagnosis not present

## 2022-09-18 ENCOUNTER — Encounter: Payer: Self-pay | Admitting: Emergency Medicine

## 2022-09-18 ENCOUNTER — Ambulatory Visit
Admission: EM | Admit: 2022-09-18 | Discharge: 2022-09-18 | Disposition: A | Discharge status: Home / Self Care | Payer: 59 | Attending: Family Medicine | Admitting: Family Medicine

## 2022-09-18 DIAGNOSIS — B9689 Other specified bacterial agents as the cause of diseases classified elsewhere: Secondary | ICD-10-CM | POA: Diagnosis not present

## 2022-09-18 DIAGNOSIS — J019 Acute sinusitis, unspecified: Secondary | ICD-10-CM | POA: Insufficient documentation

## 2022-09-18 DIAGNOSIS — R300 Dysuria: Secondary | ICD-10-CM | POA: Insufficient documentation

## 2022-09-18 LAB — URINALYSIS, ROUTINE W REFLEX MICROSCOPIC
Bilirubin Urine: NEGATIVE
Glucose, UA: NEGATIVE mg/dL
Ketones, ur: NEGATIVE mg/dL
Leukocytes,Ua: NEGATIVE
Nitrite: NEGATIVE
Protein, ur: NEGATIVE mg/dL
Specific Gravity, Urine: 1.02 (ref 1.005–1.030)
pH: 5.5 (ref 5.0–8.0)

## 2022-09-18 LAB — URINALYSIS, MICROSCOPIC (REFLEX): WBC, UA: NONE SEEN WBC/hpf (ref 0–5)

## 2022-09-18 MED ORDER — DOXYCYCLINE HYCLATE 100 MG PO CAPS: 100.0000 mg | ORAL_CAPSULE | Freq: Two times a day (BID) | ORAL | 0 refills | Status: AC

## 2022-09-18 MED ORDER — PREDNISONE 10 MG (21) PO TBPK: ORAL_TABLET | Freq: Every day | ORAL | 0 refills | Status: DC

## 2022-09-18 MED ORDER — FLUCONAZOLE 150 MG PO TABS: 150.0000 mg | ORAL_TABLET | Freq: Once | ORAL | 0 refills | Status: AC

## 2022-09-18 NOTE — ED Triage Notes (Addendum)
Pt c/o nasal congestion, headache,cough, sinus pain/pressure. Started about a week ago. Denies fever. Pt also c/o dysuria, urinary hesitancy, frequency. Symptoms have been intermittent for about 2 weeks.

## 2022-09-18 NOTE — Discharge Instructions (Addendum)
Follow up here or with your primary care provider if your symptoms are worsening or not improving with treatment.     

## 2022-09-18 NOTE — ED Provider Notes (Addendum)
[facility]    CSN: [id number] Arrival date & time: 09/18/22  1251      History   Chief Complaint Chief Complaint  Patient presents with   Nasal Congestion    HPI Alyssa Valencia is a 51 y.o. female.   HPI  Presents to UC with complaint of nasal congestion, headache, cough, sinus pain/pressure with symptoms starting over a week ago.  She denies fever, myalgias, chills.  She also complains of pain with urination, hesitancy, frequency.  Is intermittent about 2 weeks.  Past Medical History:  Diagnosis Date   GERD (gastroesophageal reflux disease)    Hypertension    Patient denies medical problems    Seasonal allergies    UTI (lower urinary tract infection)     Patient Active Problem List   Diagnosis Date Noted   Allergy 09/15/2018   Migraine headache 09/15/2018   Recurrent urinary tract infection 09/15/2018   Encounter for routine gynecological examination 03/13/2014   Positive H. pylori test 06/19/2013   Microscopic hematuria 06/09/2012    Past Surgical History:  Procedure Laterality Date   KNEE SURGERY Left    MOUTH SURGERY      OB History     Gravida  1   Para  0   Term      Preterm      AB  1   Living         SAB      IAB      Ectopic      Multiple      Live Births               Home Medications    Prior to Admission medications   Medication Sig Start Date End Date Taking? Authorizing Provider  fexofenadine (ALLEGRA) 180 MG tablet Take 180 mg by mouth daily.   Yes [provider]  levonorgestrel-ethinyl estradiol (SEASONALE,INTROVALE,JOLESSA) 0.15-0.03 MG tablet Take 1 tablet by mouth daily.   Yes [provider]  losartan (COZAAR) 100 MG tablet Take 100 mg by mouth daily. 09/09/19  Yes [provider]  omeprazole (PRILOSEC) 40 MG capsule Take by mouth. 08/13/22 08/13/23 Yes [provider]  albuterol (VENTOLIN HFA) 108 (90 Base) MCG/ACT inhaler SMARTSIG:2 Inhalation Via Inhaler Every 4  Hours PRN 07/18/20   [provider]  famotidine (PEPCID) 40 MG tablet Take 40 mg by mouth daily.    [provider]  ibuprofen (ADVIL) 800 MG tablet Take 1 tablet (800 mg total) by mouth every 8 (eight) hours as needed (pain). 07/10/22   Zenia Resides, MD  meloxicam (MOBIC) 15 MG tablet Take 1 tablet (15 mg total) by mouth daily as needed for pain. 04/05/21   Cook, Jayce G, DO  mometasone (NASONEX) 50 MCG/ACT nasal spray Place 2 sprays into the nose daily. 04/25/21   Domenick Gong, MD    Family History Family History  Problem Relation Age of Onset   Irritable bowel syndrome Mother    Nephrolithiasis Mother    Dementia Mother    Alzheimer's disease Mother    Heart attack Father     Social History Social History   Tobacco Use   Smoking status: Former    Types: Cigarettes    Quit date: 11/04/1985    Years since quitting: 36.8   Smokeless tobacco: Never  Vaping Use   Vaping Use: Never used  Substance Use Topics   Alcohol use: Yes    Alcohol/week: 3.0 standard drinks of alcohol  Types: 3 Glasses of wine per week   Drug use: No     Allergies   Penicillins, Amoxicillin, and Shellfish allergy   Review of Systems Review of Systems   Physical Exam Triage Vital Signs ED Triage Vitals [09/18/22 1401]  Enc Vitals Group     BP      Pulse      Resp      Temp      Temp src      SpO2      Weight      Height      Head Circumference      Peak Flow      Pain Score 7     Pain Loc      Pain Edu?      Excl. in GC?    No data found.  Updated Vital Signs There were no vitals taken for this visit.  Visual Acuity Right Eye Distance:   Left Eye Distance:   Bilateral Distance:    Right Eye Near:   Left Eye Near:    Bilateral Near:     Physical Exam Vitals reviewed.  Constitutional:      Appearance: Normal appearance.  HENT:     Nose:     Right Sinus: Maxillary sinus tenderness and frontal sinus tenderness present.     Left Sinus:  Maxillary sinus tenderness and frontal sinus tenderness present.  Skin:    General: Skin is warm and dry.  Neurological:     General: No focal deficit present.     Mental Status: She is alert and oriented to person, place, and time.  Psychiatric:        Mood and Affect: Mood normal.        Behavior: Behavior normal.      UC Treatments / Results  Labs (all labs ordered are listed, but only abnormal results are displayed) Labs Reviewed  URINALYSIS, ROUTINE W REFLEX MICROSCOPIC    EKG   Radiology No results found.  Procedures Procedures (including critical care time)  Medications Ordered in UC Medications - No data to display  Initial Impression / Assessment and Plan / UC Course  I have reviewed the triage vital signs and the nursing notes.  Pertinent labs & imaging results that were available during my care of the patient were reviewed by me and considered in my medical decision making (see chart for details).   Frontal and maxillary sinus tenderness with palpation bilaterally.  Given duration of symptoms, likely secondary bacterial infection following viral URI.  Will treat with doxycycline given recent use of cefdinir and cephalexin.  UA is negative for signs of UTI.  Will send for confirmatory urine culture given patient's symptoms.   Final Clinical Impressions(s) / UC Diagnoses   Final diagnoses:  None   Discharge Instructions   None    ED Prescriptions   None    PDMP not reviewed this encounter.   Charma Igo, FNP 09/18/22 1434    ImmordinoJeannett Senior, FNP 09/18/22 1435

## 2022-09-20 LAB — URINE CULTURE: Culture: NO GROWTH

## 2022-11-15 ENCOUNTER — Ambulatory Visit
Admission: EM | Admit: 2022-11-15 | Discharge: 2022-11-15 | Disposition: A | Discharge status: Home / Self Care | Payer: 59 | Attending: Physician Assistant | Admitting: Physician Assistant

## 2022-11-15 ENCOUNTER — Encounter: Payer: Self-pay | Admitting: Emergency Medicine

## 2022-11-15 DIAGNOSIS — J019 Acute sinusitis, unspecified: Secondary | ICD-10-CM | POA: Diagnosis not present

## 2022-11-15 DIAGNOSIS — N3001 Acute cystitis with hematuria: Secondary | ICD-10-CM | POA: Diagnosis not present

## 2022-11-15 DIAGNOSIS — R300 Dysuria: Secondary | ICD-10-CM | POA: Diagnosis not present

## 2022-11-15 LAB — URINALYSIS, ROUTINE W REFLEX MICROSCOPIC
Bilirubin Urine: NEGATIVE
Glucose, UA: NEGATIVE mg/dL
Ketones, ur: NEGATIVE mg/dL
Nitrite: NEGATIVE
Protein, ur: NEGATIVE mg/dL
Specific Gravity, Urine: 1.025 (ref 1.005–1.030)
pH: 5.5 (ref 5.0–8.0)

## 2022-11-15 LAB — URINALYSIS, MICROSCOPIC (REFLEX): Squamous Epithelial / HPF: >50 /HPF (ref 0–5)

## 2022-11-15 MED ORDER — FLUCONAZOLE 150 MG PO TABS: 150.0000 mg | ORAL_TABLET | Freq: Every day | ORAL | 0 refills | Status: DC

## 2022-11-15 MED ORDER — CEFDINIR 300 MG PO CAPS: 300.0000 mg | ORAL_CAPSULE | Freq: Two times a day (BID) | ORAL | 0 refills | Status: AC

## 2022-11-15 NOTE — Discharge Instructions (Signed)

## 2022-11-15 NOTE — ED Provider Notes (Signed)
[facility]    CSN: [id number] Arrival date & time: 11/15/22  0913      History   Chief Complaint Chief Complaint  Patient presents with   Dysuria   Nasal Congestion   Otalgia    HPI Alyssa Valencia is a 52 y.o. female presenting for multiple complaints.  She states she has had dysuria, frequency and urgency that began yesterday.  She is concerned about potential UTI.  Denies any back pain, vomiting or diarrhea.  No associated fever.  Also presenting for sinus pressure, cough, postnasal drainage for the past several weeks.  She has a history of allergies.  States she is not sure if it is related to her allergies or something worse.  History of sinusitis.  Takes Allegra daily and uses Nasonex.  No recent worsening of symptoms.  No wheezing or breathing difficulty.  Has taken to COVID test twice at home and they both been negative.  HPI  Past Medical History:  Diagnosis Date   GERD (gastroesophageal reflux disease)    Hypertension    Patient denies medical problems    Seasonal allergies    UTI (lower urinary tract infection)     Patient Active Problem List   Diagnosis Date Noted   Allergy 09/15/2018   Migraine headache 09/15/2018   Recurrent urinary tract infection 09/15/2018   Encounter for routine gynecological examination 03/13/2014   Positive H. pylori test 06/19/2013   Microscopic hematuria 06/09/2012    Past Surgical History:  Procedure Laterality Date   KNEE SURGERY Left    MOUTH SURGERY      OB History     Gravida  1   Para  0   Term      Preterm      AB  1   Living         SAB      IAB      Ectopic      Multiple      Live Births               Home Medications    Prior to Admission medications   Medication Sig Start Date End Date Taking? Authorizing Provider  albuterol (VENTOLIN HFA) 108 (90 Base) MCG/ACT inhaler SMARTSIG:2 Inhalation Via Inhaler Every 4 Hours PRN 07/18/20  Yes [provider]  cefdinir  (OMNICEF) 300 MG capsule Take 1 capsule (300 mg total) by mouth 2 (two) times daily for 7 days. 11/15/22 11/22/22 Yes Danton Clap, PA-C  famotidine (PEPCID) 40 MG tablet Take 40 mg by mouth daily.   Yes [provider]  fexofenadine (ALLEGRA) 180 MG tablet Take 180 mg by mouth daily.   Yes [provider]  levonorgestrel-ethinyl estradiol (SEASONALE,INTROVALE,JOLESSA) 0.15-0.03 MG tablet Take 1 tablet by mouth daily.   Yes [provider]  losartan (COZAAR) 100 MG tablet Take 100 mg by mouth daily. 09/09/19  Yes [provider]  omeprazole (PRILOSEC) 40 MG capsule Take by mouth. 08/13/22 08/13/23 Yes [provider]  ibuprofen (ADVIL) 800 MG tablet Take 1 tablet (800 mg total) by mouth every 8 (eight) hours as needed (pain). 07/10/22   Barrett Henle, MD  meloxicam (MOBIC) 15 MG tablet Take 1 tablet (15 mg total) by mouth daily as needed for pain. 04/05/21   Cook, Jayce G, DO  mometasone (NASONEX) 50 MCG/ACT nasal spray Place 2 sprays into the nose daily. 04/25/21   Melynda Ripple, MD    Family History Family History  Problem  Relation Age of Onset   Irritable bowel syndrome Mother    Nephrolithiasis Mother    Dementia Mother    Alzheimer's disease Mother    Heart attack Father     Social History Social History   Tobacco Use   Smoking status: Former    Types: Cigarettes    Quit date: 11/04/1985    Years since quitting: 37.0   Smokeless tobacco: Never  Vaping Use   Vaping Use: Never used  Substance Use Topics   Alcohol use: Yes    Alcohol/week: 3.0 standard drinks of alcohol    Types: 3 Glasses of wine per week   Drug use: No     Allergies   Penicillins, Amoxicillin, and Shellfish allergy   Review of Systems Review of Systems  Constitutional:  Negative for chills, diaphoresis, fatigue and fever.  HENT:  Positive for congestion, postnasal drip, rhinorrhea and sinus pressure. Negative for ear pain, sinus pain and sore throat.    Respiratory:  Negative for cough and shortness of breath.   Gastrointestinal:  Negative for abdominal pain, diarrhea, nausea and vomiting.  Genitourinary:  Positive for dysuria, frequency and urgency. Negative for decreased urine volume, flank pain, hematuria, pelvic pain, vaginal bleeding, vaginal discharge and vaginal pain.  Musculoskeletal:  Negative for arthralgias, back pain and myalgias.  Skin:  Negative for rash.  Neurological:  Negative for weakness and headaches.  Hematological:  Negative for adenopathy.     Physical Exam Triage Vital Signs ED Triage Vitals  Enc Vitals Group     BP      Pulse      Resp      Temp      Temp src      SpO2      Weight      Height      Head Circumference      Peak Flow      Pain Score      Pain Loc      Pain Edu?      Excl. in [place]?    No data found.  Updated Vital Signs BP 122/82 (BP Location: Left Arm)   Pulse 68   Temp 98 F (36.7 C) (Oral)   Resp 16   Ht 5\' 4"  (1.626 m)   Wt 145 lb 15.1 oz (66.2 kg)   SpO2 100%   BMI 25.05 kg/m     Physical Exam Vitals and nursing note reviewed.  Constitutional:      General: She is not in acute distress.    Appearance: Normal appearance. She is not ill-appearing or toxic-appearing.  HENT:     Head: Normocephalic and atraumatic.     Right Ear: A middle ear effusion is present.     Left Ear: A middle ear effusion is present.     Nose: Congestion present.     Mouth/Throat:     Mouth: Mucous membranes are moist.     Pharynx: Oropharynx is clear.  Eyes:     General: No scleral icterus.       Right eye: No discharge.        Left eye: No discharge.     Conjunctiva/sclera: Conjunctivae normal.  Cardiovascular:     Rate and Rhythm: Normal rate and regular rhythm.     Heart sounds: Normal heart sounds.  Pulmonary:     Effort: Pulmonary effort is normal. No respiratory distress.     Breath sounds: Normal breath sounds.  Abdominal:     Palpations:  Abdomen is soft.     Tenderness:  There is abdominal tenderness (suprapubic). There is no right CVA tenderness or left CVA tenderness.  Musculoskeletal:     Cervical back: Neck supple.  Skin:    General: Skin is dry.  Neurological:     General: No focal deficit present.     Mental Status: She is alert. Mental status is at baseline.     Motor: No weakness.     Gait: Gait normal.  Psychiatric:        Mood and Affect: Mood normal.        Behavior: Behavior normal.        Thought Content: Thought content normal.      UC Treatments / Results  Labs (all labs ordered are listed, but only abnormal results are displayed) Labs Reviewed  URINALYSIS, ROUTINE W REFLEX MICROSCOPIC - Abnormal; Notable for the following components:      Result Value   Hgb urine dipstick MODERATE (*)    Leukocytes,Ua TRACE (*)    All other components within normal limits  URINALYSIS, MICROSCOPIC (REFLEX) - Abnormal; Notable for the following components:   Bacteria, UA MANY (*)    All other components within normal limits  URINE CULTURE    EKG   Radiology No results found.  Procedures Procedures (including critical care time)  Medications Ordered in UC Medications - No data to display  Initial Impression / Assessment and Plan / UC Course  I have reviewed the triage vital signs and the nursing notes.  Pertinent labs & imaging results that were available during my care of the patient were reviewed by me and considered in my medical decision making (see chart for details).   52 year old female presents for dysuria, frequency urgency since yesterday and sinus pressure, congestion and cough for the past couple weeks.  History of chronic allergies.  Urinalysis shows moderate blood, trace leukocytes and many bacteria on microscopic analysis.  Will send urine for culture and treat for suspected UTI with cefdinir.  This will also cover possible sinusitis although I think her sinus issue is more related to chronic allergies.  We discussed  switching to Claritin and continue the Nasonex.  Reviewed return precautions.   Final Clinical Impressions(s) / UC Diagnoses   Final diagnoses:  Acute cystitis with hematuria  Dysuria  Acute non-recurrent sinusitis, unspecified location     Discharge Instructions      UTI: Based on either symptoms or urinalysis, you may have a urinary tract infection. We will send the urine for culture and call with results in a few days. Begin antibiotics at this time. Your symptoms should be much improved over the next 2-3 days. Increase rest and fluid intake. If for some reason symptoms are worsening or not improving after a couple of days or the urine culture determines the antibiotics you are taking will not treat the infection, the antibiotics may be changed. Return or go to ER for fever, back pain, worsening urinary pain, discharge, increased blood in urine. May take Tylenol or Motrin OTC for pain relief or consider AZO if no contraindications      ED Prescriptions     Medication Sig Dispense Auth. Provider   cefdinir (OMNICEF) 300 MG capsule Take 1 capsule (300 mg total) by mouth 2 (two) times daily for 7 days. 14 capsule Shirlee Latch, PA-C      PDMP not reviewed this encounter.   Shirlee Latch, PA-C 11/15/22 1025

## 2022-11-15 NOTE — ED Triage Notes (Signed)
Pt c/o dysuria, urinary frequency, headache, sinus pain/pressure, nasal congestion, bilateral ear pain. She states the urinary symptoms started yesterday but the sinus symptoms have been going on for a couple of weeks. Denies fever.

## 2022-11-16 LAB — URINE CULTURE: Culture: NO GROWTH

## 2023-01-15 ENCOUNTER — Encounter: Payer: Self-pay | Admitting: *Deleted

## 2023-01-15 ENCOUNTER — Other Ambulatory Visit: Payer: Self-pay

## 2023-01-15 ENCOUNTER — Ambulatory Visit
Admission: EM | Admit: 2023-01-15 | Discharge: 2023-01-15 | Disposition: A | Discharge status: Home / Self Care | Payer: 59 | Attending: Family | Admitting: Family

## 2023-01-15 DIAGNOSIS — J0101 Acute recurrent maxillary sinusitis: Secondary | ICD-10-CM

## 2023-01-15 DIAGNOSIS — J0100 Acute maxillary sinusitis, unspecified: Secondary | ICD-10-CM | POA: Diagnosis not present

## 2023-01-15 DIAGNOSIS — Z9109 Other allergy status, other than to drugs and biological substances: Secondary | ICD-10-CM

## 2023-01-15 MED ORDER — FLUCONAZOLE 150 MG PO TABS: 150.0000 mg | ORAL_TABLET | Freq: Once | ORAL | 1 refills | Status: AC

## 2023-01-15 MED ORDER — LEVOFLOXACIN 500 MG PO TABS: 500.0000 mg | ORAL_TABLET | Freq: Every day | ORAL | 0 refills | Status: AC

## 2023-01-15 NOTE — ED Provider Notes (Signed)
[facility]    CSN: [id number] Arrival date & time: 01/15/23  1152      History   Chief Complaint Chief Complaint  Patient presents with   sinus drainage    Headache   Sore Throat    HPI Alyssa Valencia is a 52 y.o. female.   52 year old female presents with nasal congestion, sinus pressure and headache for the past 3 weeks. Has noticed more ear pressure and slight cough. Denies any fever or GI symptoms. Has history of seasonal allergies and has seen an ENT in the past. Take Allegra daily with some relief. Has used Flonase and Nasonex in the past but very irritating to nose so has stopped. Did try OTC Flonase allergy and headache pills (Tylenol '325mg'$ , Chlorpheniramine Maleate '2mg'$  and Phenylephrine '5mg'$ ) once daily with some relief but symptoms worsened after stopping these 2 days ago. Does have a history of asthma and has used Albuterol inhaler prn. Other chronic health issues include HTN which is controlled with Losartan and GERD in which she takes Pepcid or Prilosec prn.   The history is provided by the patient.    Past Medical History:  Diagnosis Date   GERD (gastroesophageal reflux disease)    Hypertension    Patient denies medical problems    Seasonal allergies    UTI (lower urinary tract infection)     Patient Active Problem List   Diagnosis Date Noted   Allergy 09/15/2018   Migraine headache 09/15/2018   Recurrent urinary tract infection 09/15/2018   Encounter for routine gynecological examination 03/13/2014   Positive H. pylori test 06/19/2013   Microscopic hematuria 06/09/2012    Past Surgical History:  Procedure Laterality Date   KNEE SURGERY Left    MOUTH SURGERY      OB History     Gravida  1   Para  0   Term      Preterm      AB  1   Living         SAB      IAB      Ectopic      Multiple      Live Births               Home Medications    Prior to Admission medications   Medication Sig Start Date End Date Taking?  Authorizing Provider  fluconazole (DIFLUCAN) 150 MG tablet Take 1 tablet (150 mg total) by mouth once for 1 dose. May repeat in 3 to 5 days if needed 01/15/23 01/15/23 Yes Lafayette Dunlevy, Nicholes Stairs, NP  levofloxacin (LEVAQUIN) 500 MG tablet Take 1 tablet (500 mg total) by mouth daily for 7 days. 01/15/23 01/22/23 Yes Rahkim Rabalais, Nicholes Stairs, NP  albuterol (VENTOLIN HFA) 108 (90 Base) MCG/ACT inhaler SMARTSIG:2 Inhalation Via Inhaler Every 4 Hours PRN 07/18/20   [provider]  famotidine (PEPCID) 40 MG tablet Take 40 mg by mouth daily.    [provider]  fexofenadine (ALLEGRA) 180 MG tablet Take 180 mg by mouth daily.    [provider]  levonorgestrel-ethinyl estradiol (SEASONALE,INTROVALE,JOLESSA) 0.15-0.03 MG tablet Take 1 tablet by mouth daily.    [provider]  losartan (COZAAR) 100 MG tablet Take 100 mg by mouth daily. 09/09/19   [provider]  omeprazole (PRILOSEC) 40 MG capsule Take by mouth. 08/13/22 08/13/23  [provider]    Family History Family History  Problem Relation Age of Onset   Irritable bowel syndrome Mother  Nephrolithiasis Mother    Dementia Mother    Alzheimer's disease Mother    Heart attack Father     Social History Social History   Tobacco Use   Smoking status: Former    Types: Cigarettes    Quit date: 11/04/1985    Years since quitting: 37.2   Smokeless tobacco: Never  Vaping Use   Vaping Use: Never used  Substance Use Topics   Alcohol use: Yes    Alcohol/week: 3.0 standard drinks of alcohol    Types: 3 Glasses of wine per week   Drug use: No     Allergies   Doxycycline and Shellfish allergy   Review of Systems Review of Systems  Constitutional:  Positive for fatigue. Negative for activity change, appetite change, chills, diaphoresis and fever.  HENT:  Positive for congestion, ear pain (pressure), postnasal drip, sinus pressure, sinus pain and sore throat. Negative for ear discharge, facial swelling,  mouth sores, rhinorrhea and trouble swallowing.   Eyes:  Negative for pain, discharge, redness and itching.  Respiratory:  Positive for cough (slight). Negative for chest tightness, shortness of breath and wheezing.   Gastrointestinal:  Negative for nausea and vomiting.  Musculoskeletal:  Negative for arthralgias, myalgias and neck stiffness.  Skin:  Negative for color change and rash.  Allergic/Immunologic: Positive for environmental allergies and food allergies.  Neurological:  Positive for headaches. Negative for dizziness, tremors, seizures, syncope, facial asymmetry, speech difficulty, weakness and numbness.  Hematological:  Negative for adenopathy. Does not bruise/bleed easily.     Physical Exam Triage Vital Signs ED Triage Vitals  Enc Vitals Group     BP 01/15/23 1310 (!) 144/101     Pulse Rate 01/15/23 1310 64     Resp 01/15/23 1310 18     Temp 01/15/23 1310 98.1 F (36.7 C)     Temp src --      SpO2 01/15/23 1310 100 %     Weight --      Height --      Head Circumference --      Peak Flow --      Pain Score 01/15/23 1308 7     Pain Loc --      Pain Edu? --      Excl. in [place]? --    No data found.  Updated Vital Signs BP (!) 144/101   Pulse 64   Temp 98.1 F (36.7 C)   Resp 18   SpO2 100%   Visual Acuity Right Eye Distance:   Left Eye Distance:   Bilateral Distance:    Right Eye Near:   Left Eye Near:    Bilateral Near:     Physical Exam Vitals and nursing note reviewed.  Constitutional:      General: She is awake. She is not in acute distress.    Appearance: She is well-developed and well-groomed.     Comments: She is sitting on the exam table in no acute distress but appears tired and uncomfortable due to sinus pain.   HENT:     Head: Normocephalic and atraumatic.     Right Ear: Hearing, ear canal and external ear normal. No drainage. A middle ear effusion is present. Tympanic membrane is bulging. Tympanic membrane is not injected, perforated or  erythematous.     Left Ear: Hearing, tympanic membrane, ear canal and external ear normal. Tympanic membrane is not injected, perforated, erythematous or bulging.     Nose: Congestion present.     Right  Sinus: Maxillary sinus tenderness and frontal sinus tenderness present.     Left Sinus: Maxillary sinus tenderness and frontal sinus tenderness present.     Mouth/Throat:     Lips: Pink.     Mouth: Mucous membranes are moist.     Pharynx: Uvula midline. Oropharyngeal exudate and posterior oropharyngeal erythema present. No pharyngeal swelling or uvula swelling.     Comments: Yellow post nasal drainage present Eyes:     Extraocular Movements: Extraocular movements intact.     Conjunctiva/sclera: Conjunctivae normal.  Cardiovascular:     Rate and Rhythm: Normal rate and regular rhythm.     Heart sounds: Normal heart sounds. No murmur heard. Pulmonary:     Effort: Pulmonary effort is normal. No respiratory distress.     Breath sounds: Normal breath sounds and air entry. No decreased air movement. No decreased breath sounds, wheezing, rhonchi or rales.  Musculoskeletal:     Cervical back: Normal range of motion and neck supple.  Lymphadenopathy:     Cervical: No cervical adenopathy (but tender when pressing along eustachian tube pathway).  Skin:    General: Skin is warm and dry.     Capillary Refill: Capillary refill takes less than 2 seconds.     Findings: No rash.  Neurological:     General: No focal deficit present.     Mental Status: She is alert and oriented to person, place, and time.  Psychiatric:        Mood and Affect: Mood normal.        Behavior: Behavior normal. Behavior is cooperative.        Thought Content: Thought content normal.        Judgment: Judgment normal.      UC Treatments / Results  Labs (all labs ordered are listed, but only abnormal results are displayed) Labs Reviewed - No data to display  EKG   Radiology No results  found.  Procedures Procedures (including critical care time)  Medications Ordered in UC Medications - No data to display  Initial Impression / Assessment and Plan / UC Course  I have reviewed the triage vital signs and the nursing notes.  Pertinent labs & imaging results that were available during my care of the patient were reviewed by me and considered in my medical decision making (see chart for details).     Discussed that she has a sinus infection with underlying environmental allergies that are not well-controlled. Had sinus infection about 4 months ago- tried Doxycycline but bad side effects- switched to Amoxicillin. Returned 2 months ago for another sinus infection and was placed on Omnicef with some relief. Since she is having recurrent infections, she needs to go back to an ENT but will trial Levaquin '500mg'$  once daily for 7 days for now. May take Diflucan '150mg'$  once and may repeat in 3 to 5 days if yeast infection develops with antibiotic use. Continue to push fluids to help loosen up mucus in sinuses. Continue Allegra during the day as directed. May add Chlorpheniramine Maleate '4mg'$  tablets at night to help with symptoms and Tylenol '500mg'$  every 8 hours as needed for headache. Discussed that OTC Phenylephrine has not shown to be an effective decongestant. Patient should not take Pseudoephedrine due to elevated blood pressure and HTN. Avoid nasal sprays for now since they cause nasal irritation/pain. Recommend contact her PCP for referral back to ENT for further evaluation and management.  Final Clinical Impressions(s) / UC Diagnoses   Final diagnoses:  Environmental allergies  Acute recurrent maxillary sinusitis     Discharge Instructions      Recommend Levaquin '500mg'$  once daily for 7 days - this medication may not be covered by your insurance but it is a generic medication. (She be about $20 with Good Rx coupon at [facility]). If the cost is too much, please contact us but we would  prefer you take this medication if possible. May take Diflucan '150mg'$  once as needed for antibiotic -induced yeast infection. May repeat 1 tablet in 3 to 5 days if needed. Continue to push fluids to help loosen up mucus in sinuses. May continue Allegra as directed. May add Chlorpheniramine Maleate '4mg'$  tablets at nighttime (antihistamine) to help with sinus symptoms and seasonal allergies. Recommend contact your PCP for referral to ENT for further evaluation and management of seasonal allergies.     ED Prescriptions     Medication Sig Dispense Auth. Provider   levofloxacin (LEVAQUIN) 500 MG tablet Take 1 tablet (500 mg total) by mouth daily for 7 days. 7 tablet Katy Apo, NP   fluconazole (DIFLUCAN) 150 MG tablet Take 1 tablet (150 mg total) by mouth once for 1 dose. May repeat in 3 to 5 days if needed 1 tablet Golden Emile, Nicholes Stairs, NP      PDMP not reviewed this encounter.   Katy Apo, NP 01/15/23 [phone]

## 2023-01-15 NOTE — Discharge Instructions (Addendum)
Recommend Levaquin '500mg'$  once daily for 7 days - this medication may not be covered by your insurance but it is a generic medication. (She be about $20 with Good Rx coupon at [facility]). If the cost is too much, please contact us but we would prefer you take this medication if possible. May take Diflucan '150mg'$  once as needed for antibiotic -induced yeast infection. May repeat 1 tablet in 3 to 5 days if needed. Continue to push fluids to help loosen up mucus in sinuses. May continue Allegra as directed. May add Chlorpheniramine Maleate '4mg'$  tablets at nighttime (antihistamine) to help with sinus symptoms and seasonal allergies. Recommend contact your PCP for referral to ENT for further evaluation and management of seasonal allergies.

## 2023-01-15 NOTE — ED Triage Notes (Signed)
Pt reports she has had Sx's for 2 weeks. Pt tried OTC with out relief.

## 2023-01-16 DIAGNOSIS — J329 Chronic sinusitis, unspecified: Secondary | ICD-10-CM | POA: Diagnosis not present

## 2023-02-06 DIAGNOSIS — Z79899 Other long term (current) drug therapy: Secondary | ICD-10-CM | POA: Diagnosis not present

## 2023-02-06 DIAGNOSIS — D124 Benign neoplasm of descending colon: Secondary | ICD-10-CM | POA: Diagnosis not present

## 2023-02-06 DIAGNOSIS — K640 First degree hemorrhoids: Secondary | ICD-10-CM | POA: Diagnosis not present

## 2023-02-06 DIAGNOSIS — K635 Polyp of colon: Secondary | ICD-10-CM | POA: Diagnosis not present

## 2023-02-06 DIAGNOSIS — Z793 Long term (current) use of hormonal contraceptives: Secondary | ICD-10-CM | POA: Diagnosis not present

## 2023-02-06 DIAGNOSIS — K5730 Diverticulosis of large intestine without perforation or abscess without bleeding: Secondary | ICD-10-CM | POA: Diagnosis not present

## 2023-02-06 DIAGNOSIS — Z1211 Encounter for screening for malignant neoplasm of colon: Secondary | ICD-10-CM | POA: Diagnosis not present

## 2023-02-06 DIAGNOSIS — I10 Essential (primary) hypertension: Secondary | ICD-10-CM | POA: Diagnosis not present

## 2023-02-18 DIAGNOSIS — Z1330 Encounter for screening examination for mental health and behavioral disorders, unspecified: Secondary | ICD-10-CM | POA: Diagnosis not present

## 2023-02-18 DIAGNOSIS — I10 Essential (primary) hypertension: Secondary | ICD-10-CM | POA: Diagnosis not present

## 2023-02-18 DIAGNOSIS — Z1331 Encounter for screening for depression: Secondary | ICD-10-CM | POA: Diagnosis not present

## 2023-02-18 DIAGNOSIS — G43009 Migraine without aura, not intractable, without status migrainosus: Secondary | ICD-10-CM | POA: Diagnosis not present

## 2023-02-18 DIAGNOSIS — Z23 Encounter for immunization: Secondary | ICD-10-CM | POA: Diagnosis not present

## 2023-02-18 DIAGNOSIS — Z0000 Encounter for general adult medical examination without abnormal findings: Secondary | ICD-10-CM | POA: Diagnosis not present

## 2023-05-23 ENCOUNTER — Encounter: Payer: Self-pay | Admitting: Emergency Medicine

## 2023-05-23 ENCOUNTER — Ambulatory Visit
Admission: EM | Admit: 2023-05-23 | Discharge: 2023-05-23 | Disposition: A | Discharge status: Home / Self Care | Payer: No Typology Code available for payment source | Attending: Family Medicine | Admitting: Family Medicine

## 2023-05-23 DIAGNOSIS — R110 Nausea: Secondary | ICD-10-CM | POA: Diagnosis not present

## 2023-05-23 DIAGNOSIS — N3001 Acute cystitis with hematuria: Secondary | ICD-10-CM | POA: Diagnosis present

## 2023-05-23 DIAGNOSIS — B379 Candidiasis, unspecified: Secondary | ICD-10-CM | POA: Diagnosis not present

## 2023-05-23 LAB — URINALYSIS, W/ REFLEX TO CULTURE (INFECTION SUSPECTED)
Bilirubin Urine: NEGATIVE
Glucose, UA: NEGATIVE mg/dL
Ketones, ur: NEGATIVE mg/dL
Nitrite: NEGATIVE
Protein, ur: NEGATIVE mg/dL
Specific Gravity, Urine: 1.025 (ref 1.005–1.030)
pH: 6.5 (ref 5.0–8.0)

## 2023-05-23 MED ORDER — NITROFURANTOIN MONOHYD MACRO 100 MG PO CAPS: 100.0000 mg | ORAL_CAPSULE | Freq: Two times a day (BID) | ORAL | 0 refills | Status: DC

## 2023-05-23 MED ORDER — FLUCONAZOLE 150 MG PO TABS: 150.0000 mg | ORAL_TABLET | ORAL | 0 refills | Status: AC

## 2023-05-23 NOTE — ED Provider Notes (Signed)
[facility]    CSN: [id number] Arrival date & time: 05/23/23  1522      History   Chief Complaint Chief Complaint  Patient presents with   Abdominal Pain   Back Pain   Dysuria     HPI HPI Alyssa Valencia is a 52 y.o. female.    Alyssa Valencia presents for lower abdominal pain, cloudy urine, dysuria, urinary frequency and lower back pain that started 4 days ago.  Tried Tylenol prior to arrival.  Has not had any antibiotics in last 30 days.   Denies known STI exposure. No LMP recorded. (Menstrual status: Oral contraceptives).    - Abnormal vaginal discharge: no - vaginal itching: no  - vaginal odor: no - vaginal bleeding: no - Dysuria: yes - Hematuria: yes  - Urinary urgency:no  - Urinary frequency: yes  - Fever: no - Abdominal pain: yes - Pelvic pain: no - Rash/Skin lesions/mouth ulcers: no - Nausea: yes - Vomiting: no  - Back Pain: yes       Past Medical History:  Diagnosis Date   GERD (gastroesophageal reflux disease)    Hypertension    Patient denies medical problems    Seasonal allergies    UTI (lower urinary tract infection)     Patient Active Problem List   Diagnosis Date Noted   Allergy 09/15/2018   Migraine headache 09/15/2018   Recurrent urinary tract infection 09/15/2018   Encounter for routine gynecological examination 03/13/2014   Positive H. pylori test 06/19/2013   Microscopic hematuria 06/09/2012    Past Surgical History:  Procedure Laterality Date   KNEE SURGERY Left    MOUTH SURGERY      OB History     Gravida  1   Para  0   Term      Preterm      AB  1   Living         SAB      IAB      Ectopic      Multiple      Live Births               Home Medications    Prior to Admission medications   Medication Sig Start Date End Date Taking? Authorizing Provider  fluconazole (DIFLUCAN) 150 MG tablet Take 1 tablet (150 mg total) by mouth every 3 (three) days for 3 doses. 05/23/23 05/30/23 Yes Mell Guia,  Seward Meth, DO  levonorgestrel-ethinyl estradiol (SEASONALE,INTROVALE,JOLESSA) 0.15-0.03 MG tablet Take 1 tablet by mouth daily.   Yes [provider]  losartan (COZAAR) 100 MG tablet Take 100 mg by mouth daily. 09/09/19  Yes [provider]  nitrofurantoin, macrocrystal-monohydrate, (MACROBID) 100 MG capsule Take 1 capsule (100 mg total) by mouth 2 (two) times daily. 05/23/23  Yes Dream Harman, DO  omeprazole (PRILOSEC) 40 MG capsule Take by mouth. 08/13/22 08/13/23 Yes [provider]  albuterol (VENTOLIN HFA) 108 (90 Base) MCG/ACT inhaler SMARTSIG:2 Inhalation Via Inhaler Every 4 Hours PRN 07/18/20   [provider]  famotidine (PEPCID) 40 MG tablet Take 40 mg by mouth daily.    [provider]  fexofenadine (ALLEGRA) 180 MG tablet Take 180 mg by mouth daily.    [provider]    Family History Family History  Problem Relation Age of Onset   Irritable bowel syndrome Mother    Nephrolithiasis Mother    Dementia Mother    Alzheimer's disease Mother    Heart attack Father     Social  History Social History   Tobacco Use   Smoking status: Former    Current packs/day: 0.00    Types: Cigarettes    Quit date: 11/04/1985    Years since quitting: 37.5   Smokeless tobacco: Never  Vaping Use   Vaping status: Never Used  Substance Use Topics   Alcohol use: Yes    Alcohol/week: 3.0 standard drinks of alcohol    Types: 3 Glasses of wine per week   Drug use: No     Allergies   Doxycycline, Levaquin [levofloxacin], and Shellfish allergy   Review of Systems Review of Systems: :negative unless otherwise stated in HPI.      Physical Exam Triage Vital Signs ED Triage Vitals  Encounter Vitals Group     BP      Systolic BP Percentile      Diastolic BP Percentile      Pulse      Resp      Temp      Temp src      SpO2      Weight      Height      Head Circumference      Peak Flow      Pain Score      Pain Loc      Pain  Education      Exclude from Growth Chart    No data found.  Updated Vital Signs BP 129/87 (BP Location: Left Arm)   Pulse 67   Temp 98.5 F (36.9 C) (Oral)   SpO2 97%   Visual Acuity Right Eye Distance:   Left Eye Distance:   Bilateral Distance:    Right Eye Near:   Left Eye Near:    Bilateral Near:     Physical Exam GEN: well appearing female in no acute distress  CVS: well perfused  RESP: speaking in full sentences without pause  ABD: soft, non-tender, non-distended, no palpable masses, no CVA tenderness   UC Treatments / Results  Labs (all labs ordered are listed, but only abnormal results are displayed) Labs Reviewed  URINALYSIS, W/ REFLEX TO CULTURE (INFECTION SUSPECTED) - Abnormal; Notable for the following components:      Result Value   Hgb urine dipstick MODERATE (*)    Leukocytes,Ua TRACE (*)    Bacteria, UA MANY (*)    All other components within normal limits    EKG   Radiology No results found.  Procedures Procedures (including critical care time)  Medications Ordered in UC Medications - No data to display  Initial Impression / Assessment and Plan / UC Course  I have reviewed the triage vital signs and the nursing notes.  Pertinent labs & imaging results that were available during my care of the patient were reviewed by me and considered in my medical decision making (see chart for details).      Acute cystitis:  Patient is a 52 y.o. female  who presents for 4 days of dysuria and urinary frequency.  Overall patient is well-appearing and afebrile.  Vital signs stable.  UA consistent with acute cystitis.  Hematuria supported on microscopy.  Treat with Macrobid to times daily for 5 days.  Urinalysis also showed yeast.  Treat with Diflucan.  Patient has history of antibiotic associated yeast infections therefore she was given 3 tablets.  - Return precautions including abdominal pain, fever, chills, nausea, or vomiting given. Follow-up,  if  symptoms not improving or getting worse. Discussed MDM, treatment plan and plan for  follow-up with patient who agrees with plan.        Final Clinical Impressions(s) / UC Diagnoses   Final diagnoses:  Acute cystitis with hematuria  Nausea without vomiting  Yeast infection     Discharge Instructions      Stop by the pharmacy to pick up your prescriptions.  Follow up with your primary care provider as needed.      ED Prescriptions     Medication Sig Dispense Auth. Provider   fluconazole (DIFLUCAN) 150 MG tablet Take 1 tablet (150 mg total) by mouth every 3 (three) days for 3 doses. 3 tablet Shaquinta Peruski, DO   nitrofurantoin, macrocrystal-monohydrate, (MACROBID) 100 MG capsule Take 1 capsule (100 mg total) by mouth 2 (two) times daily. 10 capsule Katha Cabal, DO      PDMP not reviewed this encounter.   Katha Cabal, DO 05/23/23 1629

## 2023-05-23 NOTE — Discharge Instructions (Signed)
Stop by the pharmacy to pick up your prescriptions.  Follow up with your primary care provider as needed.  

## 2023-05-23 NOTE — ED Triage Notes (Signed)
Pt presents with lower abdominal pain, back pain, dysuria and urinary urgency x 3 days.

## 2023-12-31 ENCOUNTER — Ambulatory Visit
Admission: EM | Admit: 2023-12-31 | Discharge: 2023-12-31 | Disposition: A | Discharge status: Home / Self Care | Payer: 59 | Attending: Physician Assistant | Admitting: Physician Assistant

## 2023-12-31 DIAGNOSIS — N3000 Acute cystitis without hematuria: Secondary | ICD-10-CM | POA: Insufficient documentation

## 2023-12-31 DIAGNOSIS — R350 Frequency of micturition: Secondary | ICD-10-CM | POA: Diagnosis present

## 2023-12-31 DIAGNOSIS — T3695XA Adverse effect of unspecified systemic antibiotic, initial encounter: Secondary | ICD-10-CM | POA: Insufficient documentation

## 2023-12-31 DIAGNOSIS — J329 Chronic sinusitis, unspecified: Secondary | ICD-10-CM | POA: Diagnosis not present

## 2023-12-31 DIAGNOSIS — J3489 Other specified disorders of nose and nasal sinuses: Secondary | ICD-10-CM | POA: Diagnosis present

## 2023-12-31 DIAGNOSIS — J0100 Acute maxillary sinusitis, unspecified: Secondary | ICD-10-CM | POA: Diagnosis not present

## 2023-12-31 DIAGNOSIS — B379 Candidiasis, unspecified: Secondary | ICD-10-CM | POA: Insufficient documentation

## 2023-12-31 LAB — URINALYSIS, W/ REFLEX TO CULTURE (INFECTION SUSPECTED)
Bilirubin Urine: NEGATIVE
Glucose, UA: NEGATIVE mg/dL
Ketones, ur: NEGATIVE mg/dL
Nitrite: NEGATIVE
Protein, ur: NEGATIVE mg/dL
Specific Gravity, Urine: <1.005 — ABNORMAL LOW (ref 1.005–1.030)
pH: 5.5 (ref 5.0–8.0)

## 2023-12-31 MED ORDER — FLUCONAZOLE 150 MG PO TABS: ORAL_TABLET | ORAL | 0 refills | Status: DC

## 2023-12-31 MED ORDER — CEFDINIR 300 MG PO CAPS: 300.0000 mg | ORAL_CAPSULE | Freq: Two times a day (BID) | ORAL | 0 refills | Status: AC

## 2023-12-31 NOTE — ED Triage Notes (Signed)
 Pt c/o sinus pressure & HA x2 wks. Has tried nasal spray,IBU & APAP w/o relief.   Also c/o darker urine & odor x1 wk. Denies any urinary freq,burning or pain.

## 2023-12-31 NOTE — ED Provider Notes (Signed)
 [facility]    CSN: [id number] Arrival date & time: 12/31/23  0865      History   Chief Complaint Chief Complaint  Patient presents with   Sinus Problem    HPI Alyssa Valencia is a 53 y.o. female presenting for multiple complaints.  She states she has had dysuria, frequency and urgency that began 1 week ago.  She is concerned about potential UTI.  Denies any back pain, vomiting or diarrhea.  No associated fever.  Also presenting for sinus pressure, cough, postnasal drainage for the past several weeks.  She has a history of allergies and sinusitis.  States she is not sure if it is related to her allergies or something worse.  History of sinusitis at least 3 times per year requiring antibiotics.  Takes Allegra daily and uses Nasonex.  No recent worsening of symptoms.  No wheezing or breathing difficulty.  No other concerns.  HPI  Past Medical History:  Diagnosis Date   GERD (gastroesophageal reflux disease)    Hypertension    Patient denies medical problems    Seasonal allergies    UTI (lower urinary tract infection)     Patient Active Problem List   Diagnosis Date Noted   Allergy 09/15/2018   Migraine headache 09/15/2018   Recurrent urinary tract infection 09/15/2018   Encounter for routine gynecological examination 03/13/2014   Positive H. pylori test 06/19/2013   Microscopic hematuria 06/09/2012    Past Surgical History:  Procedure Laterality Date   KNEE SURGERY Left    MOUTH SURGERY      OB History     Gravida  1   Para  0   Term      Preterm      AB  1   Living         SAB      IAB      Ectopic      Multiple      Live Births               Home Medications    Prior to Admission medications   Medication Sig Start Date End Date Taking? Authorizing Provider  albuterol (VENTOLIN HFA) 108 (90 Base) MCG/ACT inhaler SMARTSIG:2 Inhalation Via Inhaler Every 4 Hours PRN 07/18/20  Yes [provider]  cefdinir (OMNICEF) 300 MG  capsule Take 1 capsule (300 mg total) by mouth 2 (two) times daily for 7 days. 12/31/23 01/07/24 Yes Shirlee Latch, PA-C  famotidine (PEPCID) 40 MG tablet Take 40 mg by mouth daily.   Yes [provider]  fexofenadine (ALLEGRA) 180 MG tablet Take 180 mg by mouth daily.   Yes [provider]  fluconazole (DIFLUCAN) 150 MG tablet Take 1 tab po q72 hr prn yeast infection 12/31/23  Yes Shirlee Latch, PA-C  levonorgestrel-ethinyl estradiol (SEASONALE,INTROVALE,JOLESSA) 0.15-0.03 MG tablet Take 1 tablet by mouth daily.   Yes [provider]  losartan (COZAAR) 100 MG tablet Take 100 mg by mouth daily. 09/09/19  Yes [provider]  progesterone (PROMETRIUM) 100 MG capsule Take 100 mg by mouth at bedtime 07/11/23  Yes [provider]  omeprazole (PRILOSEC) 40 MG capsule Take by mouth. 08/13/22 08/13/23  [provider]    Family History Family History  Problem Relation Age of Onset   Irritable bowel syndrome Mother    Nephrolithiasis Mother    Dementia Mother    Alzheimer's disease Mother    Heart attack Father     Social  History Social History   Tobacco Use   Smoking status: Former    Current packs/day: 0.00    Types: Cigarettes    Quit date: 11/04/1985    Years since quitting: 38.1   Smokeless tobacco: Never  Vaping Use   Vaping status: Never Used  Substance Use Topics   Alcohol use: Yes    Alcohol/week: 3.0 standard drinks of alcohol    Types: 3 Glasses of wine per week   Drug use: No     Allergies   Levofloxacin, Doxycycline, Amoxicillin-pot clavulanate, and Shellfish allergy   Review of Systems Review of Systems  Constitutional:  Negative for chills, diaphoresis, fatigue and fever.  HENT:  Positive for congestion, postnasal drip, rhinorrhea and sinus pressure. Negative for ear pain, sinus pain and sore throat.   Respiratory:  Negative for cough and shortness of breath.   Gastrointestinal:  Negative for abdominal pain,  diarrhea, nausea and vomiting.  Genitourinary:  Positive for dysuria, frequency and urgency. Negative for decreased urine volume, flank pain, hematuria, pelvic pain, vaginal bleeding, vaginal discharge and vaginal pain.  Musculoskeletal:  Negative for arthralgias, back pain and myalgias.  Skin:  Negative for rash.  Neurological:  Negative for weakness and headaches.  Hematological:  Negative for adenopathy.     Physical Exam Triage Vital Signs ED Triage Vitals  Enc Vitals Group     BP      Pulse      Resp      Temp      Temp src      SpO2      Weight      Height      Head Circumference      Peak Flow      Pain Score      Pain Loc      Pain Edu?      Excl. in GC?    No data found.  Updated Vital Signs BP 121/88 (BP Location: Left Arm)   Pulse 65   Temp 97.7 F (36.5 C) (Oral)   Resp 16   Ht 5\' 4"  (1.626 m)   Wt 149 lb (67.6 kg)   LMP 05/01/2020 (Approximate)   SpO2 99%   BMI 25.58 kg/m     Physical Exam Vitals and nursing note reviewed.  Constitutional:      General: She is not in acute distress.    Appearance: Normal appearance. She is not ill-appearing or toxic-appearing.  HENT:     Head: Normocephalic and atraumatic.     Right Ear: A middle ear effusion is present.     Left Ear: A middle ear effusion is present.     Nose: Congestion present.     Mouth/Throat:     Mouth: Mucous membranes are moist.     Pharynx: Oropharynx is clear.  Eyes:     General: No scleral icterus.       Right eye: No discharge.        Left eye: No discharge.     Conjunctiva/sclera: Conjunctivae normal.  Cardiovascular:     Rate and Rhythm: Normal rate and regular rhythm.     Heart sounds: Normal heart sounds.  Pulmonary:     Effort: Pulmonary effort is normal. No respiratory distress.     Breath sounds: Normal breath sounds.  Abdominal:     Palpations: Abdomen is soft.     Tenderness: There is abdominal tenderness (suprapubic). There is no right CVA tenderness or left CVA  tenderness.  Musculoskeletal:  Cervical back: Neck supple.  Skin:    General: Skin is dry.  Neurological:     General: No focal deficit present.     Mental Status: She is alert. Mental status is at baseline.     Motor: No weakness.     Gait: Gait normal.  Psychiatric:        Mood and Affect: Mood normal.        Behavior: Behavior normal.      UC Treatments / Results  Labs (all labs ordered are listed, but only abnormal results are displayed) Labs Reviewed  URINALYSIS, W/ REFLEX TO CULTURE (INFECTION SUSPECTED) - Abnormal; Notable for the following components:      Result Value   Color, Urine STRAW (*)    Specific Gravity, Urine <1.005 (*)    Hgb urine dipstick TRACE (*)    Leukocytes,Ua SMALL (*)    Non Squamous Epithelial PRESENT (*)    Bacteria, UA FEW (*)    All other components within normal limits  URINE CULTURE    EKG   Radiology No results found.  Procedures Procedures (including critical care time)  Medications Ordered in UC Medications - No data to display  Initial Impression / Assessment and Plan / UC Course  I have reviewed the triage vital signs and the nursing notes.  Pertinent labs & imaging results that were available during my care of the patient were reviewed by me and considered in my medical decision making (see chart for details).   53 year old female presents for dysuria, frequency urgency x 1 week. Also reporting sinus pressure, congestion and cough for the past couple weeks.  History of chronic allergies.  Urinalysis shows trace blood, small leukocytes and bacteria on microscopic analysis.  Will send urine for culture and treat for suspected UTI with cefdinir.  This will also cover possible sinusitis although I think her sinus issue is more related to chronic allergies.  We discussed switching to Claritin and continue the Nasonex.  Reviewed return precautions.  Additionally, patient request Diflucan as she is prone to yeast infections  when she takes antibiotics.  Sent to pharmacy.  Final Clinical Impressions(s) / UC Diagnoses   Final diagnoses:  Acute maxillary sinusitis, recurrence not specified  Acute cystitis without hematuria  Antibiotic-induced yeast infection  Chronic sinusitis, unspecified location     Discharge Instructions      -Begin antibiotics for sinus infection and urinary tract infection.  We may amend the treatment based on your culture results.  Increase rest and fluids.  Continue antihistamines, nasal sprays and sinus rinses.  UTI: Based on either symptoms or urinalysis, you may have a urinary tract infection. We will send the urine for culture and call with results in a few days. Begin antibiotics at this time. Your symptoms should be much improved over the next 2-3 days. Increase rest and fluid intake. If for some reason symptoms are worsening or not improving after a couple of days or the urine culture determines the antibiotics you are taking will not treat the infection, the antibiotics may be changed. Return or go to ER for fever, back pain, worsening urinary pain, discharge, increased blood in urine. May take Tylenol or Motrin OTC for pain relief or consider AZO if no contraindications       ED Prescriptions     Medication Sig Dispense Auth. Provider   cefdinir (OMNICEF) 300 MG capsule Take 1 capsule (300 mg total) by mouth 2 (two) times daily for 7 days. 14 capsule  Shirlee Latch, PA-C   fluconazole (DIFLUCAN) 150 MG tablet Take 1 tab po q72 hr prn yeast infection 2 tablet Shirlee Latch, PA-C      PDMP not reviewed this encounter.      Shirlee Latch, PA-C 12/31/23 1202

## 2023-12-31 NOTE — Discharge Instructions (Signed)
-  Begin antibiotics for sinus infection and urinary tract infection.  We may amend the treatment based on your culture results.  Increase rest and fluids.  Continue antihistamines, nasal sprays and sinus rinses.  UTI: Based on either symptoms or urinalysis, you may have a urinary tract infection. We will send the urine for culture and call with results in a few days. Begin antibiotics at this time. Your symptoms should be much improved over the next 2-3 days. Increase rest and fluid intake. If for some reason symptoms are worsening or not improving after a couple of days or the urine culture determines the antibiotics you are taking will not treat the infection, the antibiotics may be changed. Return or go to ER for fever, back pain, worsening urinary pain, discharge, increased blood in urine. May take Tylenol or Motrin OTC for pain relief or consider AZO if no contraindications

## 2024-01-02 LAB — URINE CULTURE: Culture: >=100000 — AB

## 2024-07-28 ENCOUNTER — Ambulatory Visit
Admission: EM | Admit: 2024-07-28 | Discharge: 2024-07-28 | Disposition: A | Discharge status: Home / Self Care | Attending: Emergency Medicine | Admitting: Emergency Medicine

## 2024-07-28 ENCOUNTER — Encounter: Payer: Self-pay | Admitting: Emergency Medicine

## 2024-07-28 DIAGNOSIS — J309 Allergic rhinitis, unspecified: Secondary | ICD-10-CM

## 2024-07-28 DIAGNOSIS — J3089 Other allergic rhinitis: Secondary | ICD-10-CM

## 2024-07-28 DIAGNOSIS — J302 Other seasonal allergic rhinitis: Secondary | ICD-10-CM

## 2024-07-28 MED ORDER — MOMETASONE FUROATE 50 MCG/ACT NA SUSP: 2.0000 | Freq: Every day | NASAL | 0 refills | Status: AC

## 2024-07-28 NOTE — ED Provider Notes (Signed)
 HPI  SUBJECTIVE:  Alyssa Valencia is a 53 y.o. female who presents with 3 days of itchy, watery eyes, extensive clear rhinorrhea, nasal congestion, sneezing, bilateral ear fullness, sinus pain and pressure, postnasal drip, itchy throat, and a cough due to the postnasal drip.  She is unable to sleep due to the cough.  No ear pain, fevers, body aches, headaches, facial swelling, or dental pain, nausea, vomiting, diarrhea, abdominal pain.  No known COVID exposure.  She had 2 negative home COVID test.  No antipyretic in the past 6 hours.  Antibiotics in the past month.  She tried a humidifier, hot showers, steam, has been on Flonase  for the past week and takes cetirizine  daily for allergies.  The humidifier helps, symptoms worse with going outside. She has a past medical history of hypertension, seasonal allergies which are worse in the fall, migraines, GERD.  States this is identical to previous allergy symptoms.  No history of chronic kidney disease, diabetes.  PCP: Madie Ripple  Past Medical History:  Diagnosis Date   GERD (gastroesophageal reflux disease)    Hypertension    Patient denies medical problems    Seasonal allergies    UTI (lower urinary tract infection)     Past Surgical History:  Procedure Laterality Date   KNEE SURGERY Left    MOUTH SURGERY      Family History  Problem Relation Age of Onset   Irritable bowel syndrome Mother    Nephrolithiasis Mother    Dementia Mother    Alzheimer's disease Mother    Heart attack Father     Social History   Tobacco Use   Smoking status: Former    Current packs/day: 0.00    Types: Cigarettes    Quit date: 11/04/1985    Years since quitting: 38.7   Smokeless tobacco: Never  Vaping Use   Vaping status: Never Used  Substance Use Topics   Alcohol use: Yes    Alcohol/week: 3.0 standard drinks of alcohol    Types: 3 Glasses of wine per week   Drug use: No    No current facility-administered medications for this  encounter.  Current Outpatient Medications:    Estradiol  0.25 MG/0.25GM GEL, , Disp: , Rfl:    mometasone  (NASONEX ) 50 MCG/ACT nasal spray, Place 2 sprays into the nose daily., Disp: 17 g, Rfl: 0   albuterol (VENTOLIN HFA) 108 (90 Base) MCG/ACT inhaler, SMARTSIG:2 Inhalation Via Inhaler Every 4 Hours PRN, Disp: , Rfl:    famotidine  (PEPCID ) 40 MG tablet, Take 40 mg by mouth daily., Disp: , Rfl:    levonorgestrel-ethinyl estradiol  (SEASONALE,INTROVALE,JOLESSA) 0.15-0.03 MG tablet, Take 1 tablet by mouth daily., Disp: , Rfl:    losartan (COZAAR) 100 MG tablet, Take 100 mg by mouth daily., Disp: , Rfl:    omeprazole (PRILOSEC) 40 MG capsule, Take by mouth., Disp: , Rfl:    progesterone (PROMETRIUM) 100 MG capsule, Take 100 mg by mouth at bedtime, Disp: , Rfl:    rizatriptan (MAXALT-MLT) 10 MG disintegrating tablet, Take by mouth., Disp: , Rfl:   Allergies  Allergen Reactions   Levofloxacin  Nausea Only and Other (See Comments)    Nausea, Headache, Nightsweats   Doxycycline  Other (See Comments)    GI upset and severe headaches   Amoxicillin -Pot Clavulanate Rash   Shellfish Allergy Rash     ROS  As noted in HPI.   Physical Exam  BP 120/84 (BP Location: Left Arm)   Pulse 72   Temp 98.2 F (36.8 C) (Oral)  Resp 16   LMP 05/01/2020 (Approximate)   SpO2 100%   Constitutional: Well developed, well nourished, no acute distress Eyes: PERRL, EOMI, conjunctiva normal bilaterally HENT: Normocephalic, atraumatic,mucus membranes moist.  TMs normal bilaterally.  Erythematous, swollen turbinates.  Clear nasal congestion.  Positive maxillary, frontal sinus tenderness.  Normal oropharynx.  Normal tonsils without exudate.  No postnasal drip. Respiratory: Normal inspiratory effort Cardiovascular: Normal rate GI: nondistended skin: No rash, skin intact Musculoskeletal: no deformities Neurologic: Alert & oriented x 3, CN III-XII grossly intact, no motor deficits, sensation grossly  intact Psychiatric: Speech and behavior appropriate   ED Course   Medications - No data to display  No orders of the defined types were placed in this encounter.  No results found for this or any previous visit (from the past 24 hours). No results found.  ED Clinical Impression  1. Allergic sinusitis   2. Seasonal allergic rhinitis due to other allergic trigger   3. Seasonal allergies      ED Assessment/Plan     Presentation consistent with allergic rhinitis/sinusitis.  No indications for antibiotics at this time per ISDA guidelines, discussed this with patient.  She is amenable to this plan.  Home with saline nasal irrigation, discontinue Flonase  as it has not been working over the past 7 to 8 days that she has been using it, will try Nasonex .  Switch to Allegra-D, she will monitor her blood pressure closely.  If it raises her blood pressure, that she will switch to plain Allegra.  Saline nasal irrigation with a NeilMed rinse and distilled water as often as she want.  Offered prescription of prednisone  40 mg for 5 days, but she states that she did not do well the last time she had it with headache, nausea and sensation of feeling faint and so declined it today.  Follow-up with PCP or here as needed.  She had 2 negative home COVID test.  Presentation is consistent with allergies, deferring further testing.  Discussed MDM, treatment plan, and plan for follow-up with patient  patient agrees with plan.   Meds ordered this encounter  Medications   mometasone  (NASONEX ) 50 MCG/ACT nasal spray    Sig: Place 2 sprays into the nose daily.    Dispense:  17 g    Refill:  0      *This clinic note was created using Scientist, clinical (histocompatibility and immunogenetics). Therefore, there may be occasional mistakes despite careful proofreading. ?    Van Knee, MD 07/30/24 1139

## 2024-07-28 NOTE — Discharge Instructions (Addendum)
 Start Allegra-D to up with the allergies and to decongest you.   Keep a close eye on your blood pressure.  If it causes your blood pressure to go up, then switch to regular Allegra.  Stop Flonase .  Start mometasone .  You may take 600 mg of motrin  with 1000 mg of tylenol  up to 3-4 times a day as needed for pain. This is an effective combination for pain.  Most sinus infections  do not need antibiotics unless you have a high fever, have had this for 10 days, or you get better and then get sick again. Use a NeilMed sinus rinse with distilled water as often as you want to to reduce nasal congestion.  This will also get rid of your allergies.  Follow the directions on the box.   Go to www.goodrx.com to look up your medications. This will give you a list of where you can find your prescriptions at the most affordable prices. Or you can ask the pharmacist what the cash price is. This is frequently cheaper than going through insurance.

## 2024-07-28 NOTE — ED Triage Notes (Addendum)
 Pt presents with sinus pressure, headache, drainage, and itchy throat x 2 days. Pt has tried nasal spray with no relief. At home covid test was negative.

## 2024-12-08 ENCOUNTER — Ambulatory Visit
Admission: EM | Admit: 2024-12-08 | Discharge: 2024-12-08 | Disposition: A | Discharge status: Home / Self Care | Source: Home / Self Care | Attending: Family Medicine | Admitting: Family Medicine

## 2024-12-08 DIAGNOSIS — N3001 Acute cystitis with hematuria: Secondary | ICD-10-CM

## 2024-12-08 LAB — POCT URINE DIPSTICK
Bilirubin, UA: NEGATIVE
Glucose, UA: NEGATIVE mg/dL
Ketones, POC UA: NEGATIVE mg/dL
Nitrite, UA: POSITIVE — AB
Protein Ur, POC: 100 mg/dL — AB
Spec Grav, UA: 1.02
Urobilinogen, UA: 0.2 U/dL
pH, UA: 5.5

## 2024-12-08 MED ORDER — FLUCONAZOLE 150 MG PO TABS: 150.0000 mg | ORAL_TABLET | Freq: Once | ORAL | 0 refills | Status: AC

## 2024-12-08 MED ORDER — SULFAMETHOXAZOLE-TRIMETHOPRIM 800-160 MG PO TABS: 1.0000 | ORAL_TABLET | Freq: Two times a day (BID) | ORAL | 0 refills | Status: AC

## 2024-12-08 NOTE — Discharge Instructions (Addendum)
 You have a urinary tract infection. I sent your urine for culture to be sure the antibiotic prescribed will treat your infection. Someone may call you to change antibiotics. Stop by the pharmacy to pick up your prescriptions.  Follow up with your primary care provider or return to the urgent care, if not improving.  Start the Diflucan  on your last day of antibiotics.

## 2024-12-08 NOTE — ED Triage Notes (Signed)
 Patient to Urgent Care with complaints of urinary frequency/ malodorous urine/ dysuria.   Symptoms x3 days.  Home UTI test positive.

## 2024-12-10 ENCOUNTER — Ambulatory Visit (HOSPITAL_COMMUNITY): Payer: Self-pay

## 2024-12-10 LAB — URINE CULTURE: Culture: >=100000 — AB

## 2024-12-10 MED ORDER — NITROFURANTOIN MONOHYD MACRO 100 MG PO CAPS: 100.0000 mg | ORAL_CAPSULE | Freq: Two times a day (BID) | ORAL | 0 refills | Status: AC
